# Patient Record
Sex: Male | Born: 1935
Health system: Southern US, Community
[De-identification: ages and names within clinical notes are randomized; demographics above are authoritative.]

## PROBLEM LIST (undated history)

## (undated) DIAGNOSIS — M129 Arthropathy, unspecified: Secondary | ICD-10-CM

## (undated) DIAGNOSIS — I959 Hypotension, unspecified: Secondary | ICD-10-CM

## (undated) DIAGNOSIS — E559 Vitamin D deficiency, unspecified: Secondary | ICD-10-CM

## (undated) DIAGNOSIS — E785 Hyperlipidemia, unspecified: Secondary | ICD-10-CM

## (undated) DIAGNOSIS — M6240 Contracture of muscle, unspecified site: Secondary | ICD-10-CM

## (undated) DIAGNOSIS — E039 Hypothyroidism, unspecified: Secondary | ICD-10-CM

## (undated) DIAGNOSIS — I10 Essential (primary) hypertension: Secondary | ICD-10-CM

## (undated) DIAGNOSIS — H269 Unspecified cataract: Secondary | ICD-10-CM

## (undated) DIAGNOSIS — H409 Unspecified glaucoma: Secondary | ICD-10-CM

## (undated) DIAGNOSIS — Z77090 Contact with and (suspected) exposure to asbestos: Secondary | ICD-10-CM

## (undated) DIAGNOSIS — C439 Malignant melanoma of skin, unspecified: Secondary | ICD-10-CM

## (undated) DIAGNOSIS — C459 Mesothelioma, unspecified: Secondary | ICD-10-CM

## (undated) DIAGNOSIS — R972 Elevated prostate specific antigen [PSA]: Secondary | ICD-10-CM

## (undated) DIAGNOSIS — D649 Anemia, unspecified: Secondary | ICD-10-CM

## (undated) HISTORY — PX: SKIN CANCER EXCISION: SHX779

## (undated) HISTORY — DX: Malignant melanoma of skin, unspecified: C43.9

## (undated) HISTORY — DX: Hypothyroidism, unspecified: E03.9

## (undated) HISTORY — DX: Unspecified cataract: H26.9

## (undated) HISTORY — DX: Hyperlipidemia, unspecified: E78.5

## (undated) HISTORY — DX: Contact with and (suspected) exposure to asbestos: Z77.090

## (undated) HISTORY — DX: Essential (primary) hypertension: I10

## (undated) HISTORY — DX: Hypotension, unspecified: I95.9

## (undated) HISTORY — DX: Vitamin D deficiency, unspecified: E55.9

## (undated) HISTORY — PX: OTHER SURGICAL HISTORY: SHX169

## (undated) HISTORY — DX: Arthropathy, unspecified: M12.9

## (undated) HISTORY — DX: Unspecified glaucoma: H40.9

## (undated) HISTORY — DX: Elevated prostate specific antigen (PSA): R97.20

## (undated) HISTORY — DX: Contracture of muscle, unspecified site: M62.40

## (undated) HISTORY — DX: Anemia, unspecified: D64.9

---
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
## 2002-07-25 ENCOUNTER — Encounter: Admission: RE | Admit: 2002-07-25 | Discharge: 2002-07-25 | Payer: Self-pay | Admitting: *Deleted

## 2003-10-10 ENCOUNTER — Encounter: Admission: RE | Admit: 2003-10-10 | Discharge: 2003-10-10 | Payer: Self-pay | Admitting: Internal Medicine

## 2004-09-02 ENCOUNTER — Ambulatory Visit: Payer: Self-pay | Admitting: Family Medicine

## 2004-09-30 ENCOUNTER — Ambulatory Visit: Payer: Self-pay | Admitting: Family Medicine

## 2005-10-13 ENCOUNTER — Ambulatory Visit: Payer: Self-pay | Admitting: Family Medicine

## 2005-11-10 ENCOUNTER — Ambulatory Visit: Payer: Self-pay | Admitting: Family Medicine

## 2006-08-09 ENCOUNTER — Ambulatory Visit: Payer: Self-pay | Admitting: Family Medicine

## 2007-01-27 ENCOUNTER — Encounter: Payer: Self-pay | Admitting: Family Medicine

## 2007-01-27 ENCOUNTER — Ambulatory Visit: Payer: Self-pay | Admitting: Family Medicine

## 2007-01-27 DIAGNOSIS — I10 Essential (primary) hypertension: Secondary | ICD-10-CM | POA: Insufficient documentation

## 2007-01-27 LAB — CONVERTED CEMR LAB
ALT: 28 units/L (ref 0–40)
AST: 35 units/L (ref 0–37)
Albumin: 4.6 g/dL (ref 3.5–5.2)
Alkaline Phosphatase: 44 units/L (ref 39–117)
BUN: 27 mg/dL — ABNORMAL HIGH (ref 6–23)
Basophils Absolute: 0 10*3/uL (ref 0.0–0.1)
Basophils Relative: 0.1 % (ref 0.0–1.0)
Bilirubin, Direct: 0.3 mg/dL (ref 0.0–0.3)
CO2: 32 meq/L (ref 19–32)
Calcium: 9.4 mg/dL (ref 8.4–10.5)
Chloride: 102 meq/L (ref 96–112)
Cholesterol: 221 mg/dL (ref 0–200)
Creatinine, Ser: 1.2 mg/dL (ref 0.4–1.5)
Direct LDL: 130 mg/dL
Eosinophils Absolute: 0.1 10*3/uL (ref 0.0–0.6)
Eosinophils Relative: 1.1 % (ref 0.0–5.0)
GFR calc Af Amer: 76 mL/min
GFR calc non Af Amer: 63 mL/min
Glucose, Bld: 106 mg/dL — ABNORMAL HIGH (ref 70–99)
HCT: 40.1 % (ref 39.0–52.0)
HDL: 64.6 mg/dL (ref >39.0)
Hemoglobin: 14.3 g/dL (ref 13.0–17.0)
Lymphocytes Relative: 20.1 % (ref 12.0–46.0)
MCHC: 35.6 g/dL (ref 30.0–36.0)
MCV: 95.8 fL (ref 78.0–100.0)
Monocytes Absolute: 0.6 10*3/uL (ref 0.2–0.7)
Monocytes Relative: 7.9 % (ref 3.0–11.0)
Neutro Abs: 5.8 10*3/uL (ref 1.4–7.7)
Neutrophils Relative %: 70.8 % (ref 43.0–77.0)
PSA: 1.25 ng/mL (ref 0.10–4.00)
Platelets: 246 10*3/uL (ref 150–400)
Potassium: 4.5 meq/L (ref 3.5–5.1)
RBC: 4.19 M/uL — ABNORMAL LOW (ref 4.22–5.81)
RDW: 12.2 % (ref 11.5–14.6)
Sodium: 142 meq/L (ref 135–145)
TSH: 2.52 microintl units/mL (ref 0.35–5.50)
Total Bilirubin: 1.3 mg/dL — ABNORMAL HIGH (ref 0.3–1.2)
Total CHOL/HDL Ratio: 3.4
Total Protein: 7.6 g/dL (ref 6.0–8.3)
Triglycerides: 65 mg/dL (ref 0–149)
VLDL: 13 mg/dL (ref 0–40)
WBC: 8.1 10*3/uL (ref 4.5–10.5)

## 2008-02-07 ENCOUNTER — Telehealth: Payer: Self-pay | Admitting: Family Medicine

## 2008-02-08 ENCOUNTER — Ambulatory Visit: Payer: Self-pay | Admitting: Family Medicine

## 2008-02-08 DIAGNOSIS — E785 Hyperlipidemia, unspecified: Secondary | ICD-10-CM

## 2008-02-08 DIAGNOSIS — E039 Hypothyroidism, unspecified: Secondary | ICD-10-CM

## 2008-02-08 DIAGNOSIS — M6240 Contracture of muscle, unspecified site: Secondary | ICD-10-CM | POA: Insufficient documentation

## 2008-02-08 DIAGNOSIS — J61 Pneumoconiosis due to asbestos and other mineral fibers: Secondary | ICD-10-CM | POA: Insufficient documentation

## 2008-02-08 DIAGNOSIS — M129 Arthropathy, unspecified: Secondary | ICD-10-CM

## 2008-02-08 DIAGNOSIS — Z77090 Contact with and (suspected) exposure to asbestos: Secondary | ICD-10-CM

## 2008-02-08 DIAGNOSIS — D649 Anemia, unspecified: Secondary | ICD-10-CM | POA: Insufficient documentation

## 2008-02-08 DIAGNOSIS — R972 Elevated prostate specific antigen [PSA]: Secondary | ICD-10-CM | POA: Insufficient documentation

## 2008-02-08 HISTORY — DX: Hypothyroidism, unspecified: E03.9

## 2008-02-08 HISTORY — DX: Contact with and (suspected) exposure to asbestos: Z77.090

## 2008-02-08 HISTORY — DX: Hyperlipidemia, unspecified: E78.5

## 2008-02-08 HISTORY — DX: Arthropathy, unspecified: M12.9

## 2008-02-08 HISTORY — DX: Anemia, unspecified: D64.9

## 2008-02-08 HISTORY — DX: Contracture of muscle, unspecified site: M62.40

## 2008-02-08 HISTORY — DX: Elevated prostate specific antigen (PSA): R97.20

## 2008-02-09 LAB — CONVERTED CEMR LAB
ALT: 20 units/L (ref 0–53)
AST: 26 units/L (ref 0–37)
Albumin: 4.1 g/dL (ref 3.5–5.2)
Alkaline Phosphatase: 52 units/L (ref 39–117)
BUN: 30 mg/dL — ABNORMAL HIGH (ref 6–23)
Basophils Absolute: 0 10*3/uL (ref 0.0–0.1)
Basophils Relative: 0.7 % (ref 0.0–1.0)
Bilirubin, Direct: 0.2 mg/dL (ref 0.0–0.3)
CO2: 31 meq/L (ref 19–32)
Calcium: 9.9 mg/dL (ref 8.4–10.5)
Chloride: 101 meq/L (ref 96–112)
Cholesterol: 182 mg/dL (ref 0–200)
Creatinine, Ser: 1.5 mg/dL (ref 0.4–1.5)
Eosinophils Absolute: 0.1 10*3/uL (ref 0.0–0.7)
Eosinophils Relative: 1.8 % (ref 0.0–5.0)
GFR calc Af Amer: 59 mL/min
GFR calc non Af Amer: 48 mL/min
Glucose, Bld: 102 mg/dL — ABNORMAL HIGH (ref 70–99)
HCT: 37.9 % — ABNORMAL LOW (ref 39.0–52.0)
HDL: 51.3 mg/dL (ref >39.0)
Hemoglobin: 13.5 g/dL (ref 13.0–17.0)
LDL Cholesterol: 113 mg/dL — ABNORMAL HIGH (ref 0–99)
Lymphocytes Relative: 25.2 % (ref 12.0–46.0)
MCHC: 35.6 g/dL (ref 30.0–36.0)
MCV: 96.3 fL (ref 78.0–100.0)
Monocytes Absolute: 0.6 10*3/uL (ref 0.1–1.0)
Monocytes Relative: 10 % (ref 3.0–12.0)
Neutro Abs: 3.9 10*3/uL (ref 1.4–7.7)
Neutrophils Relative %: 62.3 % (ref 43.0–77.0)
PSA: 1.56 ng/mL (ref 0.10–4.00)
Platelets: 291 10*3/uL (ref 150–400)
Potassium: 4.6 meq/L (ref 3.5–5.1)
RBC: 3.93 M/uL — ABNORMAL LOW (ref 4.22–5.81)
RDW: 12.1 % (ref 11.5–14.6)
Sodium: 141 meq/L (ref 135–145)
TSH: 2.27 microintl units/mL (ref 0.35–5.50)
Total Bilirubin: 1.1 mg/dL (ref 0.3–1.2)
Total CHOL/HDL Ratio: 3.5
Total Protein: 7.3 g/dL (ref 6.0–8.3)
Triglycerides: 91 mg/dL (ref 0–149)
VLDL: 18 mg/dL (ref 0–40)
WBC: 6.2 10*3/uL (ref 4.5–10.5)

## 2008-02-10 ENCOUNTER — Ambulatory Visit: Payer: Self-pay | Admitting: Family Medicine

## 2008-02-28 ENCOUNTER — Encounter: Payer: Self-pay | Admitting: Family Medicine

## 2008-03-01 ENCOUNTER — Ambulatory Visit: Payer: Self-pay | Admitting: Cardiology

## 2008-04-26 ENCOUNTER — Encounter: Payer: Self-pay | Admitting: Family Medicine

## 2009-02-07 ENCOUNTER — Ambulatory Visit: Payer: Self-pay | Admitting: Family Medicine

## 2009-02-07 DIAGNOSIS — H409 Unspecified glaucoma: Secondary | ICD-10-CM | POA: Insufficient documentation

## 2009-02-07 DIAGNOSIS — E559 Vitamin D deficiency, unspecified: Secondary | ICD-10-CM

## 2009-02-07 HISTORY — DX: Unspecified glaucoma: H40.9

## 2009-02-07 HISTORY — DX: Vitamin D deficiency, unspecified: E55.9

## 2009-02-07 LAB — CONVERTED CEMR LAB
Bilirubin Urine: NEGATIVE
Glucose, Urine, Semiquant: NEGATIVE
Nitrite: NEGATIVE
Specific Gravity, Urine: 1.02
Urobilinogen, UA: 0.2
WBC Urine, dipstick: NEGATIVE
pH: 5.5

## 2009-02-13 LAB — CONVERTED CEMR LAB
ALT: 19 units/L (ref 0–53)
AST: 32 units/L (ref 0–37)
Albumin: 4.3 g/dL (ref 3.5–5.2)
Alkaline Phosphatase: 44 units/L (ref 39–117)
BUN: 17 mg/dL (ref 6–23)
Basophils Relative: 0 % (ref 0.0–3.0)
Bilirubin, Direct: 0.2 mg/dL (ref 0.0–0.3)
CO2: 32 meq/L (ref 19–32)
Calcium: 9.5 mg/dL (ref 8.4–10.5)
Chloride: 101 meq/L (ref 96–112)
Cholesterol: 198 mg/dL (ref 0–200)
Creatinine, Ser: 1.1 mg/dL (ref 0.4–1.5)
Eosinophils Relative: 1.4 % (ref 0.0–5.0)
GFR calc non Af Amer: 68.95 mL/min (ref >60)
Glucose, Bld: 88 mg/dL (ref 70–99)
HCT: 41.2 % (ref 39.0–52.0)
HDL: 69.1 mg/dL (ref >39.00)
Hemoglobin: 14.6 g/dL (ref 13.0–17.0)
LDL Cholesterol: 117 mg/dL — ABNORMAL HIGH (ref 0–99)
Lymphocytes Relative: 18.1 % (ref 12.0–46.0)
MCHC: 35.4 g/dL (ref 30.0–36.0)
MCV: 96.6 fL (ref 78.0–100.0)
Monocytes Relative: 6.6 % (ref 3.0–12.0)
Neutrophils Relative %: 73.9 % (ref 43.0–77.0)
PSA: 1.56 ng/mL (ref 0.10–4.00)
Platelets: 183 10*3/uL (ref 150.0–400.0)
Potassium: 3.9 meq/L (ref 3.5–5.1)
RBC: 4.27 M/uL (ref 4.22–5.81)
RDW: 12 % (ref 11.5–14.6)
Sodium: 140 meq/L (ref 135–145)
TSH: 1.72 microintl units/mL (ref 0.35–5.50)
Total Bilirubin: 1.3 mg/dL — ABNORMAL HIGH (ref 0.3–1.2)
Total CHOL/HDL Ratio: 3
Total Protein: 7.7 g/dL (ref 6.0–8.3)
Triglycerides: 62 mg/dL (ref 0.0–149.0)
VLDL: 12.4 mg/dL (ref 0.0–40.0)
Vit D, 25-Hydroxy: 22 ng/mL — ABNORMAL LOW (ref 30–89)
WBC: 9.9 10*3/uL (ref 4.5–10.5)

## 2010-02-10 ENCOUNTER — Telehealth: Payer: Self-pay | Admitting: Family Medicine

## 2010-02-18 ENCOUNTER — Ambulatory Visit: Payer: Self-pay | Admitting: Family Medicine

## 2010-02-18 DIAGNOSIS — I959 Hypotension, unspecified: Secondary | ICD-10-CM

## 2010-02-18 HISTORY — DX: Hypotension, unspecified: I95.9

## 2010-03-19 ENCOUNTER — Ambulatory Visit: Payer: Self-pay | Admitting: Family Medicine

## 2010-03-19 LAB — CONVERTED CEMR LAB
Nitrite: NEGATIVE
WBC Urine, dipstick: NEGATIVE

## 2010-03-20 LAB — CONVERTED CEMR LAB
AST: 27 units/L (ref 0–37)
Albumin: 4.5 g/dL (ref 3.5–5.2)
Alkaline Phosphatase: 51 units/L (ref 39–117)
Basophils Absolute: 0 10*3/uL (ref 0.0–0.1)
Bilirubin, Direct: 0.3 mg/dL (ref 0.0–0.3)
Calcium: 9.5 mg/dL (ref 8.4–10.5)
Creatinine, Ser: 1.1 mg/dL (ref 0.4–1.5)
Eosinophils Absolute: 0.1 10*3/uL (ref 0.0–0.7)
GFR calc non Af Amer: 71.76 mL/min (ref >60)
Glucose, Bld: 99 mg/dL (ref 70–99)
HDL: 65.8 mg/dL (ref >39.00)
Hemoglobin: 14.6 g/dL (ref 13.0–17.0)
Lymphocytes Relative: 20.8 % (ref 12.0–46.0)
Monocytes Relative: 9 % (ref 3.0–12.0)
Neutro Abs: 4 10*3/uL (ref 1.4–7.7)
Neutrophils Relative %: 68 % (ref 43.0–77.0)
RBC: 4.29 M/uL (ref 4.22–5.81)
RDW: 13.1 % (ref 11.5–14.6)
Sodium: 144 meq/L (ref 135–145)
Total CHOL/HDL Ratio: 2
Triglycerides: 58 mg/dL (ref 0.0–149.0)
VLDL: 11.6 mg/dL (ref 0.0–40.0)
Vit D, 25-Hydroxy: 35 ng/mL (ref 30–89)

## 2010-10-09 NOTE — Progress Notes (Signed)
Summary: REFILL REQUEST (Diovan)  Phone Note Refill Request Call back at [phone] Message from:  Patient on February 10, 2010 3:37 PM  Refills Requested: Medication #1:  DIOVAN HCT 160-25 MG  TABS 1 once daily   Notes: [facility] in [place] [employer] [place].  Pt adv that he set up an appt for cpx in July but he will run out of medication (Diovan) before his appt date/time.... Pt would like a Rx for at least a months worth of medication sent to [facility] in [place], [place].   Initial call taken by: Debbra Riding,  February 10, 2010 3:37 PM    Prescriptions: DIOVAN HCT 160-25 MG  TABS (VALSARTAN-HYDROCHLOROTHIAZIDE) 1 once daily  #30 x 0   Entered by:   Raechel Ache, RN   Authorized by:   Judithann Sheen MD   Signed by:   Raechel Ache, RN on 02/10/2010   Method used:   Electronically to        [facility] (retail)       [address]       [place], [place]  [address]       Ph: [phone]       Fax: [phone]   RxID:   [id number]

## 2010-10-09 NOTE — Assessment & Plan Note (Signed)
Summary: DIZZY SPELLS? // RS   Vital Signs:  Patient profile:   77 year old male Height:      69.5 inches Weight:      191 pounds BMI:     27.90 O2 Sat:      98 % Temp:     98.2 degrees F Pulse rate:   81 / minute Pulse rhythm:   regular BP sitting:   140 / 80  (left arm)  Vitals Entered By: Pura Spice, RN (February 18, 2010 10:00 AM) CC: dizzy spells and stopped BP med on Saturday. drinks 2 cups coffeee qd   Is Patient Diabetic? No   History of Present Illness: Disseminate-year-old white married male with previous hypertension complains of dizziness over the past 10 days and has noticed his blood pressure has been ranging from 90-110/70 and he decided to stop his blood pressure medicines of Diovan HCT he did improve as far as his discs He has observed that he had some PVCs and has decreased his caffeine which is improved Relates he is due to have a examination or review his problems on July 16 No other complaints    EKG  Procedure date:  02/18/2010  Findings:       sinus rhythm with rate of:  78 premature ventricular contractions right bundle branch block  Allergies: 1)  * Pennicillin  Past History:  Past Medical History: Last updated: 01/27/2007 HX ASBESTOS EXPOSURE Hypertension  Risk Factors: Smoking Status: quit (02/07/2009) Packs/Day: 1.0 (02/07/2009)  Review of Systems      See HPI  The patient denies anorexia, fever, weight loss, weight gain, vision loss, decreased hearing, hoarseness, chest pain, syncope, dyspnea on exertion, peripheral edema, prolonged cough, headaches, hemoptysis, abdominal pain, melena, hematochezia, severe indigestion/heartburn, hematuria, incontinence, genital sores, muscle weakness, suspicious skin lesions, transient blindness, difficulty walking, depression, unusual weight change, abnormal bleeding, enlarged lymph nodes, angioedema, breast masses, and testicular masses.    Physical Exam  General:   Well-developed,well-nourished,in no acute distress; alert,appropriate and cooperative throughout examination Head:  Normocephalic and atraumatic without obvious abnormalities. No apparent alopecia or balding. Eyes:  No corneal or conjunctival inflammation noted. EOMI. Perrla. Funduscopic exam benign, without hemorrhages, exudates or papilledema. Vision grossly normal. Ears:  External ear exam shows no significant lesions or deformities.  Otoscopic examination reveals clear canals, tympanic membranes are intact bilaterally without bulging, retraction, inflammation or discharge. Hearing is grossly normal bilaterally. Nose:  External nasal examination shows no deformity or inflammation. Nasal mucosa are pink and moist without lesions or exudates. Mouth:  Oral mucosa and oropharynx without lesions or exudates.  Teeth in good repair. Neck:  No deformities, masses, or tenderness noted. Chest Wall:  No deformities, masses, tenderness or gynecomastia noted. Lungs:  Normal respiratory effort, chest expands symmetrically. Lungs are clear to auscultation, no crackles or wheezes. Heart:  Normal rate and regular rhythm. S1 and S2 normal without gallop, murmur, click, rub or other extra sounds. Extremities:  no exam   Impression & Recommendations:  Problem # 1:  HYPOTENSION (ICD-458.9) Assessment New decreased Diovan 80 mg  Problem # 2:  DIZZINESS (ICD-780.4)  Orders: EKG w/ Interpretation (93000) rare PVC otherwise normal decrease hypertension medicine  Problem # 3:  ARTHRITIS (ICD-716.90) Assessment: Improved  Complete Medication List: 1)  Xalatan 0.005 % Soln (Latanoprost) 2)  Vitamin D (ergocalciferol) 50000 Unit Caps (Ergocalciferol) .Marland Kitchen.. 1 by mouth weekly 3)  Lisinopril 20 Mg Tabs (Lisinopril) .Marland Kitchen.. 1 once daily for bp 4)  Diovan 80 Mg  Tabs (Valsartan) .... One q. day  Patient Instructions: 1)  My impression is that the drop in her blood pressure and closure dizziness visual wire by  stopping her previous medication dosage if you have improved 2)  Since stopping it however her blood pressure has increased to 140/80 and I recommend that you restart Diovan 80 mg q. day 3)  We will reevaluate when you come in on July 60 Prescriptions: LISINOPRIL 20 MG TABS (LISINOPRIL) 1 once daily for BP  #30 x 11   Entered and Authorized by:   Judithann Sheen MD   Signed by:   Judithann Sheen MD on 02/18/2010   Method used:   Electronically to        [facility] (retail)       [address]       [place], [place]  [address]       Ph: [phone]       Fax: [phone]   RxID:   [id number]

## 2010-10-09 NOTE — Assessment & Plan Note (Signed)
Summary: emp-will fast///ccm rsc bmp/njr   Vital Signs:  Patient profile:   77 year old Thomas [name] Height:      70 inches Weight:      189 pounds BMI:     27.22 O2 Sat:      96 % Temp:     98 degrees F Pulse rate:   78 / minute Pulse rhythm:   regular BP sitting:   144 / 84  (left arm)  Vitals Entered By: Pura Spice, RN (March 19, 2010 8:30 AM)  Contraindications/Deferment of Procedures/Staging:    Test/Procedure: Colonoscopy    Reason for deferment: patient declined     Test/Procedure: Pneumovax vaccine    Reason for deferment: patient declined  CC: go over problems fasting     History of Present Illness: Rep3at BP 83/ 65This 77 year old white married Thomas [name] is in today to discussH his medical problems and refill his medications however he has not been taking his blood pressure medicine Diovan Since his blood pressure was elevated slightly when he came in to change her Diovan to lisinopril 20 mg. He relates he is very active has no complaints, his arthritic problem in the past had been under control he does not take any medicines at this time. He is fasting so we will get this served blood test today Discussed colonoscopic exam but patient has never had an exam then does not desire to have one in the future.  Allergies: 1)  * Pennicillin  Past History:  Past Medical History: Last updated: 01/27/2007 HX ASBESTOS EXPOSURE Hypertension  Risk Factors: Smoking Status: quit (02/07/2009) Packs/Day: 1.0 (02/07/2009)  Past Surgical History: Dupyreenes  contractures bilaterally ,Dr. Butler Denmark  Past History:  Care Management: Dermatology: Dr Nita Sells Ophthalmology: Dr Eulah Pont   Review of Systems      See HPI  The patient denies anorexia, fever, weight loss, weight gain, vision loss, decreased hearing, hoarseness, chest pain, syncope, dyspnea on exertion, peripheral edema, prolonged cough, headaches, hemoptysis, abdominal pain, melena, hematochezia, severe  indigestion/heartburn, hematuria, incontinence, genital sores, muscle weakness, suspicious skin lesions, transient blindness, difficulty walking, depression, unusual weight change, abnormal bleeding, enlarged lymph nodes, angioedema, breast masses, and testicular masses.    Physical Exam  General:  Well-developed,well-nourished,in no acute distress; alert,appropriate and cooperative throughout examination Head:  Normocephalic and atraumatic without obvious abnormalities. No apparent alopecia or balding. Eyes:  No corneal or conjunctival inflammation noted. EOMI. Perrla. Funduscopic exam benign, without hemorrhages, exudates or papilledema. Vision grossly normal. Ears:  External ear exam shows no significant lesions or deformities.  Otoscopic examination reveals clear canals, tympanic membranes are intact bilaterally without bulging, retraction, inflammation or discharge. Hearing is grossly normal bilaterally. Nose:  External nasal examination shows no deformity or inflammation. Nasal mucosa are pink and moist without lesions or exudates. Mouth:  Oral mucosa and oropharynx without lesions or exudates.  Teeth in good repair. Neck:  No deformities, masses, or tenderness noted. Chest Wall:  No deformities, masses, tenderness or gynecomastia noted. Breasts:  No masses or gynecomastia noted Lungs:  Normal respiratory effort, chest expands symmetrically. Lungs are clear to auscultation, no crackles or wheezes. Heart:  Normal rate and regular rhythm. S1 and S2 normal without gallop, murmur, click, rub or other extra sounds. Abdomen:  Bowel sounds positive,abdomen soft and non-tender without masses, organomegaly or hernias noted. Rectal:  No external abnormalities noted. Normal sphincter tone. No rectal masses or tenderness. Genitalia:  Testes bilaterally descended without nodularity, tenderness or masses. No scrotal masses or lesions. No  penis lesions or urethral discharge. Prostate:  Prostate gland firm  and smooth, no enlargement, nodularity, tenderness, mass, asymmetry or induration. Msk:  No deformity or scoliosis noted of thoracic or lumbar spine.   Pulses:  R and L carotid,radial,femoral,dorsalis pedis and posterior tibial pulses are full and equal bilaterally Extremities:  No clubbing, cyanosis, edema, or deformity noted with normal full range of motion of all joints.   Neurologic:  No cranial nerve deficits noted. Station and gait are normal. Plantar reflexes are down-going bilaterally. DTRs are symmetrical throughout. Sensory, motor and coordinative functions appear intact. Skin:  Intact without suspicious lesions or rashes Cervical Nodes:  No lymphadenopathy noted Axillary Nodes:  No palpable lymphadenopathy Inguinal Nodes:  No significant adenopathy Psych:  Cognition and judgment appear intact. Alert and cooperative with normal attention span and concentration. 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 Tharon Aquas  Problem # 4:  ARTHRITIS (ICD-716.90) Assessment: Improved  Problem # 5:  HYPERTENSION (ICD-401.9) Assessment: Improved  The following medications were removed from the medication list:    Diovan 80 Mg Tabs (Valsartan) ..... One q. day His updated medication list for this problem includes:    Lisinopril 20 Mg Tabs (Lisinopril) .Marland Kitchen... 1 once daily for bp  Orders: Prescription Created Electronically [phone])  Complete Medication List: 1)  Xalatan 0.005 % Soln (Latanoprost) Thomas)  Vitamin D (ergocalciferol) 50000 Unit Caps (Ergocalciferol) .Marland Kitchen.. 1 by mouth weekly 3)  Lisinopril 20 Mg Tabs (Lisinopril) .Marland Kitchen.. 1 once daily for bp  Other Orders: UA Dipstick w/o  Micro (automated)  (81003) Venipuncture (91478) TLB-Lipid Panel (80061-LIPID) TLB-BMP (Basic Metabolic Panel-BMET) (80048-METABOL) TLB-CBC Platelet - w/Differential (85025-CBCD) TLB-Hepatic/Liver Function Pnl (80076-HEPATIC) TLB-TSH (Thyroid Stimulating Hormone) (84443-TSH) TLB-PSA (Prostate Specific Antigen) (84153-PSA)  Patient Instructions: 1)  Continue regu;ar activitystart Lisinopril if BP stays above 140/ Thomas)  Will call lab results   Eye Exam  Sept 2010 Eye Exam --Dr Eulah Pont    Laboratory Results   Urine Tests  Date/Time Recieved: March 19, 2010 12:21 PM  Date/Time Reported: March 19, 2010 12:21 PM   Routine Urinalysis   Color: yellow Appearance: Clear Glucose: negative   (Normal Range: Negative) Bilirubin: negative   (Normal Range: Negative) Ketone: negative   (Normal Range: Negative) Spec. Gravity: >=1.030   (Normal Range: 1.003-1.035) Blood: trace-lysed   (Normal Range: Negative) pH: 5.0   (Normal Range: 5.0-8.0) Protein: 1+   (Normal Range: Negative) Urobilinogen: 0.Thomas   (Normal Range: 0-1) Nitrite: negative   (Normal Range: Negative) Leukocyte Esterace: negative   (Normal Range: Negative)    Comments: Wynona Canes, CMA  March 19, 2010 12:21 PM

## 2011-09-25 ENCOUNTER — Encounter: Payer: Self-pay | Admitting: Family Medicine

## 2011-09-28 ENCOUNTER — Ambulatory Visit: Payer: Self-pay | Admitting: Family Medicine

## 2011-10-08 ENCOUNTER — Ambulatory Visit (INDEPENDENT_AMBULATORY_CARE_PROVIDER_SITE_OTHER): Payer: Medicare Other | Admitting: Family Medicine

## 2011-10-08 ENCOUNTER — Encounter: Payer: Self-pay | Admitting: Family Medicine

## 2011-10-08 VITALS — BP 210/90 | Temp 97.8°F | Ht 70.0 in | Wt 185.0 lb

## 2011-10-08 DIAGNOSIS — I10 Essential (primary) hypertension: Secondary | ICD-10-CM

## 2011-10-08 LAB — BASIC METABOLIC PANEL
BUN: 18 mg/dL (ref 6–23)
CO2: 33 mEq/L — ABNORMAL HIGH (ref 19–32)
Calcium: 9.3 mg/dL (ref 8.4–10.5)
GFR: 77.33 mL/min (ref >60.00)
Glucose, Bld: 163 mg/dL — ABNORMAL HIGH (ref 70–99)
Sodium: 140 mEq/L (ref 135–145)

## 2011-10-08 LAB — POCT URINALYSIS DIPSTICK
Glucose, UA: NEGATIVE
Nitrite, UA: NEGATIVE
Spec Grav, UA: 1.02
Urobilinogen, UA: 0.2
pH, UA: 6.5

## 2011-10-08 LAB — CBC WITH DIFFERENTIAL/PLATELET
Basophils Absolute: 0 10*3/uL (ref 0.0–0.1)
Basophils Relative: 0.7 % (ref 0.0–3.0)
Eosinophils Absolute: 0.1 10*3/uL (ref 0.0–0.7)
HCT: 39.7 % (ref 39.0–52.0)
Hemoglobin: 13.8 g/dL (ref 13.0–17.0)
Lymphs Abs: 1.6 10*3/uL (ref 0.7–4.0)
MCHC: 34.7 g/dL (ref 30.0–36.0)
MCV: 98.6 fl (ref 78.0–100.0)
Monocytes Absolute: 0.5 10*3/uL (ref 0.1–1.0)
Neutro Abs: 3.4 10*3/uL (ref 1.4–7.7)
RBC: 4.03 Mil/uL — ABNORMAL LOW (ref 4.22–5.81)
RDW: 12.9 % (ref 11.5–14.6)

## 2011-10-08 LAB — HEPATIC FUNCTION PANEL
AST: 23 U/L (ref 0–37)
Albumin: 4.1 g/dL (ref 3.5–5.2)
Alkaline Phosphatase: 42 U/L (ref 39–117)
Total Protein: 7.1 g/dL (ref 6.0–8.3)

## 2011-10-08 MED ORDER — ATENOLOL-CHLORTHALIDONE 50-25 MG PO TABS: 1.0000 | ORAL_TABLET | Freq: Every day | ORAL | Status: DC

## 2011-10-08 NOTE — Patient Instructions (Signed)
Stay at complete rest at home  Complete no salt diet  Blood pressure check 3 times daily,,,,,,,,,,,,,, purchase a digital pump up blood pressure cuff  Tenoretic.......... one half tablet now then starting tomorrow morning one tablet daily in the morning  Return on Monday at 8:15 AM for followup,,,,,,,,,, when you return bring a record of all your blood pressure readings and the new device

## 2011-10-08 NOTE — Progress Notes (Signed)
  Subjective:    Patient ID: Thomas [name], male    DOB: December 20, 1929, 78 y.o.   MRN: [id number]  HPI Thomas [name] is a 78 year old married male nonsmoker who comes in today as a new patient having transferred from Dr. Scotty Court  He takes no medications except for drops for glaucoma. He states he has a history of hypertension lost weight blood pressure came down to normal and therefore he stopped his medication. He states he took his blood pressure at home a month ago and it was normal. BP today 110/90 right arm sitting position pulse 70 and regular neurologically asymptomatic last physical examination and labs 2 years ago  His blood pressure was confirmed by me. At 12:45 PM he was given 0.1 of clonidine and 20 mg of Benicar by mouth. EP was monitored every 15 minutes x1 hour   finally his blood pressure began to drop it's now 180/80     Review of Systems    general cardiovascular neurologic review of systems otherwise negative Objective:   Physical Exam  Well-developed thin male in no acute distress examination of the heart and lungs are normal abdomen normal no abdominal aneurysm no bruit peripheral pulses normal except for trace edema      Assessment & Plan:  Malignant hypertension,,,,,,,,,,,,,,,,,,, medication given as above BP check every 15 minutes x4 after one hour the patient was discharged home on medication and bedrest to be followed up on Monday

## 2011-10-12 ENCOUNTER — Ambulatory Visit (INDEPENDENT_AMBULATORY_CARE_PROVIDER_SITE_OTHER): Payer: Medicare Other | Admitting: Family Medicine

## 2011-10-12 ENCOUNTER — Encounter: Payer: Self-pay | Admitting: Family Medicine

## 2011-10-12 VITALS — BP 170/78 | Temp 98.1°F | Wt 182.0 lb

## 2011-10-12 DIAGNOSIS — I10 Essential (primary) hypertension: Secondary | ICD-10-CM

## 2011-10-12 MED ORDER — AMLODIPINE BESYLATE 5 MG PO TABS: 5.0000 mg | ORAL_TABLET | Freq: Every day | ORAL | Status: DC

## 2011-10-12 NOTE — Patient Instructions (Addendum)
Continue the Tenoretic one tablet daily add Norvasc 5 mg daily  Complete salt free diet  Walk 30 minutes daily  Drank 3,,,,,,,,,,,,,,,,8 ounce glasses of water a day  Check your blood pressure daily in the morning and return in one week for followup   Your lab work looked fairly normal except your blood sugar was 163........... avoid sugar

## 2011-10-12 NOTE — Progress Notes (Signed)
  Subjective:    Patient ID: Thomas [name], male    DOB: Jul 18, 1930, 78 y.o.   MRN: [id number]  HPI Thomas [name] is a 78 year old married male nonsmoker who comes in today for reevaluation of hypertension  We saw him last Thursday blood pressure was 180/90. There is a typo in the original dictation. The typo red 110/80 that is incorrect. He was given clonidine and beta blocker are maintained here in the office for an hour Sally's blood pressure started dropped therefore recent he was sent home at bed rest on Tenoretic 50-25 daily. Today his blood pressure is 170/80 pulse is dropped to 70 from 80. No side effects from medication   Review of Systems General and cardiovascular review of systems otherwise negative    Objective:   Physical Exam Well-developed well-nourished male in no acute distress BP right arm sitting position 170/80       Assessment & Plan:  Hypertension not at goal plan is,,,,,,,,,,, continue Tenoretic add Norvasc 5 mg daily BP check every morning followup in one week 6,

## 2011-10-13 ENCOUNTER — Ambulatory Visit: Payer: Medicare Other | Admitting: Family Medicine

## 2011-10-19 ENCOUNTER — Encounter: Payer: Self-pay | Admitting: Family Medicine

## 2011-10-19 ENCOUNTER — Ambulatory Visit (INDEPENDENT_AMBULATORY_CARE_PROVIDER_SITE_OTHER): Payer: Medicare Other | Admitting: Family Medicine

## 2011-10-19 DIAGNOSIS — I10 Essential (primary) hypertension: Secondary | ICD-10-CM

## 2011-10-19 NOTE — Progress Notes (Signed)
  Subjective:    Patient ID: Thomas [name], male    DOB: 1929-09-28, 78 y.o.   MRN: [id number]  HPI Thomas [name] is a 78 year old married male nonsmoker who comes back today for reevaluation of hypertension  I have been working with him over the last 6 weeks because his blood pressure was markedly elevated. Norvasc 5 mg daily and Tenoretic 50-25 BP now 140/74 pulse 60 and regular   Review of Systems    general and cardiovascular review of systems negative Objective:   Physical Exam  Well-developed well-nourished male in no acute distress BP right arm sitting position 140/70      Assessment & Plan:  Hypertension at goal continue current therapy BP check weekly return when necessary

## 2011-10-19 NOTE — Patient Instructions (Signed)
Continue your current medication  For the next month check your blood pressure daily in the morning and then if it continues to be normal.number around 140 ,,,,,,,,,,, diastolic 85 or less,,,,,,,,,,, then check your blood pressure weekly  Return in one year for annual physical examination sooner if any problems

## 2012-07-29 ENCOUNTER — Other Ambulatory Visit: Payer: Self-pay | Admitting: Family Medicine

## 2012-08-23 ENCOUNTER — Other Ambulatory Visit: Payer: Self-pay | Admitting: Family Medicine

## 2012-09-01 ENCOUNTER — Other Ambulatory Visit: Payer: Self-pay | Admitting: Family Medicine

## 2012-09-01 MED ORDER — ATENOLOL-CHLORTHALIDONE 50-25 MG PO TABS: 1.0000 | ORAL_TABLET | Freq: Every day | ORAL | Status: DC

## 2012-09-01 MED ORDER — AMLODIPINE BESYLATE 5 MG PO TABS: 5.0000 mg | ORAL_TABLET | Freq: Every day | ORAL | Status: DC

## 2012-09-01 NOTE — Telephone Encounter (Signed)
Rx sent to pharmacy   

## 2012-09-01 NOTE — Telephone Encounter (Signed)
Pt has cpx sch for 11-2012. Pt needs refills on norvasc 5 mg and tenoretic 50-25 mg call into [facility] in [place]

## 2012-11-09 ENCOUNTER — Encounter: Payer: Medicare Other | Admitting: Family Medicine

## 2012-12-28 ENCOUNTER — Ambulatory Visit (INDEPENDENT_AMBULATORY_CARE_PROVIDER_SITE_OTHER)
Admission: RE | Admit: 2012-12-28 | Discharge: 2012-12-28 | Disposition: A | Discharge status: Home / Self Care | Payer: Medicare Other | Source: Ambulatory Visit | Attending: Family Medicine | Admitting: Family Medicine

## 2012-12-28 ENCOUNTER — Encounter: Payer: Self-pay | Admitting: Family Medicine

## 2012-12-28 ENCOUNTER — Ambulatory Visit (INDEPENDENT_AMBULATORY_CARE_PROVIDER_SITE_OTHER): Payer: Medicare Other | Admitting: Family Medicine

## 2012-12-28 VITALS — BP 124/80 | Temp 98.3°F | Ht 70.0 in | Wt 180.0 lb

## 2012-12-28 DIAGNOSIS — I10 Essential (primary) hypertension: Secondary | ICD-10-CM

## 2012-12-28 DIAGNOSIS — R972 Elevated prostate specific antigen [PSA]: Secondary | ICD-10-CM

## 2012-12-28 DIAGNOSIS — Z77090 Contact with and (suspected) exposure to asbestos: Secondary | ICD-10-CM

## 2012-12-28 DIAGNOSIS — Z0000 Encounter for general adult medical examination without abnormal findings: Secondary | ICD-10-CM

## 2012-12-28 DIAGNOSIS — H409 Unspecified glaucoma: Secondary | ICD-10-CM

## 2012-12-28 LAB — POCT URINALYSIS DIPSTICK
Blood, UA: NEGATIVE
Spec Grav, UA: 1.015
Urobilinogen, UA: 0.2
pH, UA: 7

## 2012-12-28 LAB — BASIC METABOLIC PANEL
BUN: 18 mg/dL (ref 6–23)
CO2: 32 mEq/L (ref 19–32)
Calcium: 9.5 mg/dL (ref 8.4–10.5)
Creatinine, Ser: 1.1 mg/dL (ref 0.4–1.5)
Glucose, Bld: 91 mg/dL (ref 70–99)

## 2012-12-28 LAB — CBC WITH DIFFERENTIAL/PLATELET
Basophils Absolute: 0 10*3/uL (ref 0.0–0.1)
Eosinophils Absolute: 0.1 10*3/uL (ref 0.0–0.7)
Hemoglobin: 14.5 g/dL (ref 13.0–17.0)
Lymphocytes Relative: 32.4 % (ref 12.0–46.0)
MCHC: 34.4 g/dL (ref 30.0–36.0)
Neutro Abs: 3.2 10*3/uL (ref 1.4–7.7)
RDW: 12.6 % (ref 11.5–14.6)

## 2012-12-28 LAB — TSH: TSH: 2.02 u[IU]/mL (ref 0.35–5.50)

## 2012-12-28 MED ORDER — AMLODIPINE BESYLATE 5 MG PO TABS: 5.0000 mg | ORAL_TABLET | Freq: Every day | ORAL | Status: DC

## 2012-12-28 MED ORDER — ATENOLOL-CHLORTHALIDONE 50-25 MG PO TABS: ORAL_TABLET | ORAL | Status: DC

## 2012-12-28 NOTE — Patient Instructions (Signed)
Decrease the Tenoretic,,,,,,,,,,,,,,,, only take one half tab daily in the morning  Check your blood pressure daily in the morning  Return in 4 weeks for followup  When he returned bring a record of all your blood pressure readings and the device

## 2012-12-28 NOTE — Progress Notes (Signed)
  Subjective:    Patient ID: Thomas [name], male    DOB: 05-May-1930, 79 y.o.   MRN: [id number]  HPI Thomas [name] is an 79 year old married male nonsmoker who comes in today for a Medicare wellness examination because of a history of hypertension, glaucoma, and history of asbestos exposure  He gets routine eye care and uses eyedrops for the glaucoma. Recent eye exam by Dr. Eulah Pont normal pressures. He gets regular dental care. Vaccinations up-to-date. Cognitive function normal he walks on a daily basis home health safety reviewed no issues identified, no guns in the house, he does have a health care power of attorney and living will  Medications reviewed in detail BP 124/80. He states he's not lightheaded when he stands up but at age 79 I would like to see his systolic blood pressure a little higher   Review of Systems  Constitutional: Negative.   HENT: Negative.   Eyes: Negative.   Respiratory: Negative.   Cardiovascular: Negative.   Gastrointestinal: Negative.   Genitourinary: Negative.   Musculoskeletal: Negative.   Skin: Negative.   Neurological: Negative.   Psychiatric/Behavioral: Negative.        Objective:   Physical Exam  Constitutional: He is oriented to person, place, and time. He appears well-developed and well-nourished.  HENT:  Head: Normocephalic and atraumatic.  Right Ear: External ear normal.  Left Ear: External ear normal.  Nose: Nose normal.  Mouth/Throat: Oropharynx is clear and moist.  Eyes: Conjunctivae and EOM are normal. Pupils are equal, round, and reactive to light.  Neck: Normal range of motion. Neck supple. No JVD present. No tracheal deviation present. No thyromegaly present.  Cardiovascular: Normal rate, regular rhythm, normal heart sounds and intact distal pulses.  Exam reveals no gallop and no friction rub.   No murmur heard. No carotid or aortic bruits peripheral pulses normal  Pulmonary/Chest: Effort normal and breath sounds normal. No stridor. No  respiratory distress. He has no wheezes. He has no rales. He exhibits no tenderness.  Barrel-shaped chest  Abdominal: Soft. Bowel sounds are normal. He exhibits no distension and no mass. There is no tenderness. There is no rebound and no guarding.  Genitourinary: Rectum normal, prostate normal and penis normal. Guaiac negative stool. No penile tenderness.  Musculoskeletal: Normal range of motion. He exhibits no edema and no tenderness.  Lymphadenopathy:    He has no cervical adenopathy.  Neurological: He is alert and oriented to person, place, and time. He has normal reflexes. No cranial nerve deficit. He exhibits normal muscle tone.  Skin: Skin is warm and dry. No rash noted. No erythema. No pallor.  Psychiatric: He has a normal mood and affect. His behavior is normal. Judgment and thought content normal.          Assessment & Plan:  Hypertension BP too low decrease Tenoretic to one half tab daily BP check daily followup in 4 weeks  Glaucoma continue eyedrops  History of asbestos exposure chest x-ray  History of elevated PSA with normal exam

## 2013-01-23 ENCOUNTER — Encounter: Payer: Self-pay | Admitting: Family Medicine

## 2013-01-23 ENCOUNTER — Ambulatory Visit (INDEPENDENT_AMBULATORY_CARE_PROVIDER_SITE_OTHER): Payer: Medicare Other | Admitting: Family Medicine

## 2013-01-23 VITALS — BP 140/70 | Temp 98.1°F | Wt 180.0 lb

## 2013-01-23 DIAGNOSIS — I10 Essential (primary) hypertension: Secondary | ICD-10-CM

## 2013-01-23 NOTE — Progress Notes (Signed)
  Subjective:    Patient ID: Thomas [name], male    DOB: 03-27-1930, 79 y.o.   MRN: [id number]  HPI Flank is a 79 year old male who comes in today for followup of 2 problems  We have been really adjusting his medication for hypertension BP 140/70 on Norvasc 5 mg daily and Tenoretic one half tab daily  Recent chest x-ray shows no change in the pleural lesions. He's had a history of his spasticity exposure   Review of Systems    review of systems negative Objective:   Physical Exam  Well-developed well-nourished male in no acute distress BP right arm sitting position 140/70      Assessment & Plan:  Hypertension at goal continue current therapy followup CPX in one year  History of asbestos exposure stable chest x-ray

## 2013-01-23 NOTE — Patient Instructions (Signed)
Continue your current medications  Check your blood pressure weekly  Goal for the systolic 135-145,,,,,,,, diastolic 85-95  Return in one year for general physical examination when you are due

## 2013-04-12 ENCOUNTER — Other Ambulatory Visit: Payer: Self-pay

## 2013-07-13 ENCOUNTER — Other Ambulatory Visit: Payer: Self-pay

## 2014-01-09 ENCOUNTER — Ambulatory Visit (INDEPENDENT_AMBULATORY_CARE_PROVIDER_SITE_OTHER): Payer: Medicare Other | Admitting: Family Medicine

## 2014-01-09 ENCOUNTER — Ambulatory Visit (INDEPENDENT_AMBULATORY_CARE_PROVIDER_SITE_OTHER)
Admission: RE | Admit: 2014-01-09 | Discharge: 2014-01-09 | Disposition: A | Discharge status: Home / Self Care | Payer: Medicare Other | Source: Ambulatory Visit | Attending: Family Medicine | Admitting: Family Medicine

## 2014-01-09 ENCOUNTER — Encounter: Payer: Self-pay | Admitting: Family Medicine

## 2014-01-09 VITALS — BP 120/80 | Temp 98.6°F | Ht 69.75 in | Wt 180.0 lb

## 2014-01-09 DIAGNOSIS — R972 Elevated prostate specific antigen [PSA]: Secondary | ICD-10-CM

## 2014-01-09 DIAGNOSIS — Z0000 Encounter for general adult medical examination without abnormal findings: Secondary | ICD-10-CM

## 2014-01-09 DIAGNOSIS — Z77090 Contact with and (suspected) exposure to asbestos: Secondary | ICD-10-CM

## 2014-01-09 DIAGNOSIS — M129 Arthropathy, unspecified: Secondary | ICD-10-CM

## 2014-01-09 DIAGNOSIS — M6240 Contracture of muscle, unspecified site: Secondary | ICD-10-CM

## 2014-01-09 DIAGNOSIS — D649 Anemia, unspecified: Secondary | ICD-10-CM

## 2014-01-09 DIAGNOSIS — I10 Essential (primary) hypertension: Secondary | ICD-10-CM

## 2014-01-09 DIAGNOSIS — H409 Unspecified glaucoma: Secondary | ICD-10-CM

## 2014-01-09 LAB — CBC WITH DIFFERENTIAL/PLATELET
BASOS ABS: 0 10*3/uL (ref 0.0–0.1)
Basophils Relative: 0.6 % (ref 0.0–3.0)
EOS ABS: 0.1 10*3/uL (ref 0.0–0.7)
Eosinophils Relative: 0.8 % (ref 0.0–5.0)
HEMATOCRIT: 40.8 % (ref 39.0–52.0)
Hemoglobin: 14.1 g/dL (ref 13.0–17.0)
LYMPHS ABS: 1.9 10*3/uL (ref 0.7–4.0)
Lymphocytes Relative: 25.8 % (ref 12.0–46.0)
MCHC: 34.6 g/dL (ref 30.0–36.0)
MCV: 97.3 fl (ref 78.0–100.0)
Monocytes Absolute: 0.7 10*3/uL (ref 0.1–1.0)
Monocytes Relative: 10.1 % (ref 3.0–12.0)
NEUTROS PCT: 62.7 % (ref 43.0–77.0)
Neutro Abs: 4.6 10*3/uL (ref 1.4–7.7)
Platelets: 255 10*3/uL (ref 150.0–400.0)
RBC: 4.2 Mil/uL — ABNORMAL LOW (ref 4.22–5.81)
RDW: 12.6 % (ref 11.5–14.6)
WBC: 7.3 10*3/uL (ref 4.5–10.5)

## 2014-01-09 LAB — HEPATIC FUNCTION PANEL
ALT: 15 U/L (ref 0–53)
AST: 30 U/L (ref 0–37)
Albumin: 4.4 g/dL (ref 3.5–5.2)
Alkaline Phosphatase: 47 U/L (ref 39–117)
BILIRUBIN DIRECT: 0.2 mg/dL (ref 0.0–0.3)
Total Bilirubin: 1 mg/dL (ref 0.2–1.2)
Total Protein: 7.5 g/dL (ref 6.0–8.3)

## 2014-01-09 LAB — POCT URINALYSIS DIPSTICK
Bilirubin, UA: NEGATIVE
Glucose, UA: NEGATIVE
Leukocytes, UA: NEGATIVE
Nitrite, UA: NEGATIVE
PH UA: 5
Spec Grav, UA: 1.02
UROBILINOGEN UA: 0.2

## 2014-01-09 LAB — LIPID PANEL
Cholesterol: 190 mg/dL (ref 0–200)
HDL: 69 mg/dL (ref >39.00)
LDL Cholesterol: 110 mg/dL — ABNORMAL HIGH (ref 0–99)
Total CHOL/HDL Ratio: 3
Triglycerides: 57 mg/dL (ref 0.0–149.0)
VLDL: 11.4 mg/dL (ref 0.0–40.0)

## 2014-01-09 LAB — BASIC METABOLIC PANEL
BUN: 23 mg/dL (ref 6–23)
CALCIUM: 9.7 mg/dL (ref 8.4–10.5)
CHLORIDE: 99 meq/L (ref 96–112)
CO2: 30 mEq/L (ref 19–32)
CREATININE: 1.1 mg/dL (ref 0.4–1.5)
GFR: 71.07 mL/min (ref >60.00)
Glucose, Bld: 89 mg/dL (ref 70–99)
Potassium: 4.1 mEq/L (ref 3.5–5.1)
Sodium: 139 mEq/L (ref 135–145)

## 2014-01-09 LAB — PSA: PSA: 1.52 ng/mL (ref 0.10–4.00)

## 2014-01-09 LAB — TSH: TSH: 2.69 u[IU]/mL (ref 0.35–4.50)

## 2014-01-09 MED ORDER — ATENOLOL-CHLORTHALIDONE 50-25 MG PO TABS: ORAL_TABLET | ORAL | Status: DC

## 2014-01-09 MED ORDER — AMLODIPINE BESYLATE 5 MG PO TABS: 5.0000 mg | ORAL_TABLET | Freq: Every day | ORAL | Status: DC

## 2014-01-09 NOTE — Progress Notes (Signed)
Pre visit review using our clinic review tool, if applicable. No additional management support is needed unless otherwise documented below in the visit note. 

## 2014-01-09 NOTE — Progress Notes (Signed)
   Subjective:    Patient ID: Thomas [name], male    DOB: Dec 26, 1929, 80 y.o.   MRN: [id number]  HPI [name] is a 80 year old married male nonsmoker who comes in today for a Medicare wellness examination because of a history of hypertension, glaucoma, osteoarthritis, elevated PSA, history of asbestos exposure  He gets routine eye care and but not dental care. He's no longer needs colonoscopy. He declines vaccination updates  Cognitive function normal he walks on a daily basis home health safety reviewed no issues identified, no his guns in the house, he does have a health care power of attorney and living will  He is profoundly deaf and does not have his hearing aids today.   Review of Systems  Constitutional: Negative.   HENT: Negative.   Eyes: Negative.   Respiratory: Negative.   Cardiovascular: Negative.   Gastrointestinal: Negative.   Genitourinary: Negative.   Musculoskeletal: Negative.   Skin: Negative.   Neurological: Negative.   Psychiatric/Behavioral: Negative.        Objective:   Physical Exam  Nursing note and vitals reviewed. Constitutional: He is oriented to person, place, and time. He appears well-developed and well-nourished.  HENT:  Head: Normocephalic and atraumatic.  Right Ear: External ear normal.  Left Ear: External ear normal.  Nose: Nose normal.  Mouth/Throat: Oropharynx is clear and moist.  Eyes: Conjunctivae and EOM are normal. Pupils are equal, round, and reactive to light.  Neck: Normal range of motion. Neck supple. No JVD present. No tracheal deviation present. No thyromegaly present.  Cardiovascular: Normal rate, regular rhythm, normal heart sounds and intact distal pulses.  Exam reveals no gallop and no friction rub.   No murmur heard. Pulmonary/Chest: Effort normal and breath sounds normal. No stridor. No respiratory distress. He has no wheezes. He has no rales. He exhibits no tenderness.  Abdominal: Soft. Bowel sounds are normal. He exhibits  no distension and no mass. There is no tenderness. There is no rebound and no guarding.  Genitourinary: Rectum normal, prostate normal and penis normal. Guaiac negative stool. No penile tenderness.  Musculoskeletal: Normal range of motion. He exhibits no edema and no tenderness.  Lymphadenopathy:    He has no cervical adenopathy.  Neurological: He is alert and oriented to person, place, and time. He has normal reflexes. No cranial nerve deficit. He exhibits normal muscle tone.  Skin: Skin is warm and dry. No rash noted. No erythema. No pallor.  Total body skin exam normal except for 3 and scar left subscapular area where he had a melanoma removed  Psychiatric: He has a normal mood and affect. His behavior is normal. Judgment and thought content normal.          Assessment & Plan:  Healthy male  Hypertension at goal continue current therapy  History of glaucoma continue eyedrops from ophthalmologists  Osteoarthritis exercise elevated PSA check PSA history of asbestos exposure

## 2014-01-09 NOTE — Patient Instructions (Signed)
Labs today  Go to the main office today for your followup chest x-ray  Return in one year for general Medicare wellness examination sooner if any problems  I would recommend you consider updating all your vaccinations

## 2014-01-10 ENCOUNTER — Telehealth: Payer: Self-pay | Admitting: Family Medicine

## 2014-01-10 NOTE — Telephone Encounter (Signed)
Relevant patient education assigned to patient using Emmi. ° °

## 2014-01-18 ENCOUNTER — Telehealth: Payer: Self-pay | Admitting: Family Medicine

## 2014-01-18 NOTE — Telephone Encounter (Signed)
Labs normal and patient is aware.

## 2014-01-18 NOTE — Telephone Encounter (Signed)
Pt needs blood work results °

## 2014-03-02 ENCOUNTER — Encounter: Payer: Self-pay | Admitting: Family Medicine

## 2015-01-16 ENCOUNTER — Encounter: Payer: Self-pay | Admitting: Physician Assistant

## 2015-01-16 ENCOUNTER — Ambulatory Visit (INDEPENDENT_AMBULATORY_CARE_PROVIDER_SITE_OTHER): Payer: Medicare Other | Admitting: Physician Assistant

## 2015-01-16 VITALS — BP 130/60 | HR 56 | Temp 97.8°F | Resp 18 | Ht 68.25 in | Wt 175.0 lb

## 2015-01-16 DIAGNOSIS — Z0000 Encounter for general adult medical examination without abnormal findings: Secondary | ICD-10-CM

## 2015-01-16 DIAGNOSIS — I10 Essential (primary) hypertension: Secondary | ICD-10-CM

## 2015-01-16 DIAGNOSIS — Z23 Encounter for immunization: Secondary | ICD-10-CM

## 2015-01-16 DIAGNOSIS — H409 Unspecified glaucoma: Secondary | ICD-10-CM | POA: Diagnosis not present

## 2015-01-16 LAB — CBC WITH DIFFERENTIAL/PLATELET
Basophils Absolute: 0.1 10*3/uL (ref 0.0–0.1)
Basophils Relative: 1 % (ref 0–1)
EOS ABS: 0.2 10*3/uL (ref 0.0–0.7)
Eosinophils Relative: 3 % (ref 0–5)
HCT: 39 % (ref 39.0–52.0)
Hemoglobin: 13.9 g/dL (ref 13.0–17.0)
LYMPHS ABS: 1.6 10*3/uL (ref 0.7–4.0)
Lymphocytes Relative: 25 % (ref 12–46)
MCH: 33.5 pg (ref 26.0–34.0)
MCHC: 35.6 g/dL (ref 30.0–36.0)
MCV: 94 fL (ref 78.0–100.0)
MONOS PCT: 8 % (ref 3–12)
MPV: 9.4 fL (ref 8.6–12.4)
Monocytes Absolute: 0.5 10*3/uL (ref 0.1–1.0)
Neutro Abs: 4 10*3/uL (ref 1.7–7.7)
Neutrophils Relative %: 63 % (ref 43–77)
Platelets: 232 10*3/uL (ref 150–400)
RBC: 4.15 MIL/uL — ABNORMAL LOW (ref 4.22–5.81)
RDW: 13.2 % (ref 11.5–15.5)
WBC: 6.3 10*3/uL (ref 4.0–10.5)

## 2015-01-16 LAB — COMPLETE METABOLIC PANEL WITH GFR
ALT: 12 U/L (ref 0–53)
AST: 23 U/L (ref 0–37)
Albumin: 4.1 g/dL (ref 3.5–5.2)
Alkaline Phosphatase: 47 U/L (ref 39–117)
BILIRUBIN TOTAL: 0.9 mg/dL (ref 0.2–1.2)
BUN: 23 mg/dL (ref 6–23)
CALCIUM: 9.3 mg/dL (ref 8.4–10.5)
CO2: 27 meq/L (ref 19–32)
CREATININE: 1.06 mg/dL (ref 0.50–1.35)
Chloride: 100 mEq/L (ref 96–112)
GFR, EST NON AFRICAN AMERICAN: 65 mL/min
GFR, Est African American: 75 mL/min
Glucose, Bld: 83 mg/dL (ref 70–99)
Potassium: 3.8 mEq/L (ref 3.5–5.3)
Sodium: 140 mEq/L (ref 135–145)
Total Protein: 7 g/dL (ref 6.0–8.3)

## 2015-01-16 LAB — LIPID PANEL
CHOL/HDL RATIO: 2.8 ratio
CHOLESTEROL: 176 mg/dL (ref 0–200)
HDL: 63 mg/dL (ref >=40)
LDL CALC: 95 mg/dL (ref 0–99)
TRIGLYCERIDES: 91 mg/dL (ref <150)
VLDL: 18 mg/dL (ref 0–40)

## 2015-01-16 LAB — TSH: TSH: 2.641 u[IU]/mL (ref 0.350–4.500)

## 2015-01-16 NOTE — Progress Notes (Signed)
Patient ID: Thomas [name] MRN: [id number], DOB: 1929/10/13 81 y.o. Date of Encounter: 01/16/2015, 2:14 PM    Chief Complaint: Physical (CPE)  HPI: 81 y.o. y/o white male here for CPE.  He also presents as a new patient to establish care with our practice.  He states that he was seeing Dr. Sherren Mocha with [facility] but that he is retiring. Says that his last visit with him was April 2015.  Also, patient's wife became a patient with her office about a year ago.  He says that recently he had been feeling some discomfort in his left low back. Says that he would feel it when he was bent forward to do the wash the dishes. Also would feel it when he walk. Says that he  never felt any discomfort when he was sitting. He says that actually this has been a lot better over the past week. Says that over the past week he has been busy lifting bags of concrete etc. and doesn't know if this is had any effect on it but says that the discomfort is actually been better. Has had no pain numbness or tingling down the leg or foot and no weakness in the leg or foot.  No other complaints or concerns today.  He said sees a dermatologist every 3 months since he was diagnosed with melanoma in 2014. He sees his ophthalmologist every 6 months. States that he sees no other medical providers on a routine basis.   Review of Systems: Consitutional: No fever, chills, fatigue, night sweats, lymphadenopathy, or weight changes. Eyes: No visual changes, eye redness, or discharge. ENT/Mouth: Ears: No otalgia, tinnitus, hearing loss, discharge. Nose: No congestion, rhinorrhea, sinus pain, or epistaxis. Throat: No sore throat, post nasal drip, or teeth pain. Cardiovascular: No CP, palpitations, diaphoresis, DOE, edema, orthopnea, PND. Respiratory: No cough, hemoptysis, SOB, or wheezing. Gastrointestinal: No anorexia, dysphagia, reflux, pain, nausea, vomiting, hematemesis, diarrhea, constipation, BRBPR, or  melena. Genitourinary: No dysuria, frequency, urgency, hematuria, incontinence, nocturia, decreased urinary stream, discharge, impotence, or testicular pain/masses. Musculoskeletal: No decreased ROM, myalgias, stiffness, joint swelling, or weakness. Skin: No rash, erythema, lesion changes, pain, warmth, jaundice, or pruritis. Neurological: No headache, dizziness, syncope, seizures, tremors, memory loss, coordination problems, or paresthesias. Psychological: No anxiety, depression, hallucinations, SI/HI. Endocrine: No fatigue, polydipsia, polyphagia, polyuria, or known diabetes. All other systems were reviewed and are otherwise negative.  Past Medical History  Diagnosis Date  . Hypertension   . ANEMIA 02/08/2008  . ARTHRITIS 02/08/2008  . 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 ELEVATED PROSTATE SPECIFIC ANTIGEN 02/08/2008  . HISTORY OF ASBESTOS EXPOSURE 02/08/2008  . HYPERLIPIDEMIA 02/08/2008  . HYPOTENSION 02/18/2010  . HYPOTHYROIDISM 02/08/2008  . VITAMIN D DEFICIENCY 02/07/2009  . Melanoma     left shoulder  . Cataract     left eye  . GLAUCOMA, LEFT EYE 02/07/2009    left eye     Past Surgical History  Procedure Laterality Date  . Dupyreenes contractures-bilat      Home Meds:  Outpatient Prescriptions Prior to Visit  Medication Sig Dispense Refill  . amLODipine (NORVASC) 5 MG tablet Take 1 tablet (5 mg total) by mouth daily. 90 tablet 3  . atenolol-chlorthalidone (TENORETIC 50) 50-25 MG per tablet One half tab every morning 90 tablet 3  . latanoprost (XALATAN) 0.005 % ophthalmic solution 1 drop at bedtime.    . Multiple Vitamins-Minerals (EYE VITAMINS) CAPS Take by mouth.     No facility-administered medications prior to visit.  Allergies:  Allergies  Allergen Reactions  . Penicillins Rash    REACTION: rash    History   Social History  . Marital Status: Married    Spouse Name: N/A  . Number of Children: N/A  . Years of Education: N/A   Occupational History  . Not on file.    Social History Main Topics  . Smoking status: Former Smoker    Quit date: 10/08/1971  . Smokeless tobacco: Not on file  . Alcohol Use: Not on file  . Drug Use: Not on file  . Sexual Activity: Not on file   Other Topics Concern  . Not on file   Social History Narrative    Family History  Problem Relation Age of Onset  . Hypertension Mother   . Heart disease Brother   . Cancer Sister     Physical Exam: Blood pressure 130/60, pulse 56, temperature 97.8 F (36.6 C), temperature source Oral, resp. rate 18, height 5' 8.25" (1.734 m), weight 175 lb (79.379 kg).  General: Well developed, well nourished, WM. Appears in no acute distress. HEENT: Normocephalic, atraumatic. Conjunctiva pink, sclera non-icteric. Pupils 2 mm constricting to 1 mm, round, regular, and equally reactive to light and accomodation. EOMI. Internal auditory canal clear. TMs with good cone of light and without pathology. Nasal mucosa pink. Nares are without discharge. No sinus tenderness. Oral mucosa pink. Pharynx without exudate.  He wears a hearing aid in the right ear.  Neck: Supple. Trachea midline. No thyromegaly. Full ROM. No lymphadenopathy. Lungs: Clear to auscultation bilaterally without wheezes, rales, or rhonchi. Breathing is of normal effort and unlabored. Cardiovascular: RRR with S1 S2. No murmurs, rubs, or gallops. Distal pulses 2+ symmetrically. No carotid or abdominal bruits. Abdomen: Soft, non-tender, non-distended with normoactive bowel sounds. No hepatosplenomegaly or masses. No rebound/guarding. No CVA tenderness. No hernias. Musculoskeletal: Full range of motion and 5/5 strength throughout. Without swelling, atrophy, tenderness, crepitus, or warmth. Extremities without clubbing, cyanosis, or edema.  Skin: Warm and moist without erythema, ecchymosis, wounds, or rash. Neuro: A+Ox3. CN II-XII grossly intact. Moves all extremities spontaneously. Full sensation throughout. Normal gait. DTR 2+ throughout  upper and lower extremities. Finger to nose intact. Psych:  Responds to questions appropriately with a normal affect.   Assessment/Plan:  81 y.o. y/o white male here for CPE  -1. Visit for preventive health examination  A. Screening Labs: - CBC with Differential/Platelet - COMPLETE METABOLIC PANEL WITH GFR - Lipid panel - TSH   B. Screening For Prostate Cancer: He had a PSA 01/09/2014 which was normal. Given his age 23, no longer needs prostate cancer screening.  C. Screening For Colorectal Cancer:  Given age of 40, no longer needs screening.  D. Immunizations: Flu-------------N/A Tetanus------Td 02/07/2009 Pneumococcal----when I reviewed the immunizations section of epic, I saw documented " Pneumovax 23" but no date listed next to it. I assumed that he had received Pneumovax 23 in the past but the date was just unknown.  I assumed he had received the Pneumovax 23 in the past but had not received Prevnar 13. I then discussed with the patient-- administering the Prevnar 13 today-- and he was agreeable. Therefore I told our staff to give him the Prevnar 13. However, after the fact----staff informed me that in another section of epic they saw documented "Pneumovax 23 declined". If this is the case, and he had not received Pneumovax 23 in the past,  then [place] 23 IN 6 - 12 MONTHS.  Zostavax--- will discuss this at next visit.   2. Essential hypertension Blood pressure well controlled. Continue current medications. Check lab to monitor. - COMPLETE METABOLIC PANEL WITH GFR  3. Glaucoma Per Ophthalmology  4. Need for prophylactic vaccination against Streptococcus pneumoniae (pneumococcus) - Pneumococcal conjugate vaccine 13-valent     Subjective:   Patient presents for Medicare Annual/Subsequent preventive examination.   Review Past Medical/Family/Social: Each of these sections has been reviewed and updated in epic today. Also documented above.   Risk  Factors  Current exercise habits:  He walks 30 minutes every day. Says that if it is raining outside then he will walk in his basement. He also works in his yard and garden. Dietary issues discussed: He already is eating a low sodium low cholesterol low carbohydrate diet.  Cardiac risk factors: Male, age, hypertension  Depression Screen  (Note: if answer to either of the following is "Yes", a more complete depression screening is indicated)  Over the past two weeks, have you felt down, depressed or hopeless? No Over the past two weeks, have you felt little interest or pleasure in doing things? No Have you lost interest or pleasure in daily life? No Do you often feel hopeless? No Do you cry easily over simple problems? No   Activities of Daily Living  In your present state of health, do you have any difficulty performing the following activities?:  Driving? No  Managing money? No  Feeding yourself? No  Getting from bed to chair? No  Climbing a flight of stairs? No  Preparing food and eating?: No  Bathing or showering? No  Getting dressed: No  Getting to the toilet? No  Using the toilet:No  Moving around from place to place: No  In the past year have you fallen or had a near fall?:No  Are you sexually active? No  Do you have more than one partner? No   Hearing Difficulties: No  Do you often ask people to speak up or repeat themselves? Yes Do you experience ringing or noises in your ears? Yes Do you have difficulty understanding soft or whispered voices? Yes Do you feel that you have a problem with memory? No Do you often misplace items? No  Do you feel safe at home? Yes  Cognitive Testing  Alert? Yes Normal Appearance?Yes  Oriented to person? Yes Place? Yes  Time? Yes  Recall of three objects? Yes  Can perform simple calculations? Yes  Displays appropriate judgment?Yes  Can read the correct time from a watch face?Yes   List the Names of Other Physician/Practitioners you  currently use:  He sees dermatologist every 3 months since being diagnosed with melanoma in 2014. He sees ophthalmologist every 6 months. No other medical providers.  Indicate any recent Medical Services you may have received from other than [facility] providers in the past year (date may be approximate).   Screening Tests / Date---see above Colonoscopy                     Zostavax  Mammogram  Influenza Vaccine  Tetanus/tdap    Assessment:    Annual wellness medicare exam   Plan:    During the course of the visit the patient was educated and counseled about appropriate screening and preventive services including:  Screening mammography  Colorectal cancer screening  Shingles vaccine. Prescription given to that she can get the vaccine at the pharmacy or Medicare part D.  Screen + for depression. PHQ- 9 score of 12 (  moderate depression). We discussed the options of counseling versus possibly a medication. I encouraged her strongly think about the counseling. She is going through some medical problems currently and her husband is as well Mrs. been very stressful for her. She says she will think about it. She does have Xanax to use as needed. Though she may benefit from an SSRI for her more depressive type symptoms but she wants to hold off at this time.  I aksed her to please have her cardioloist send records since we have none on file.  Diet review for nutrition referral? Yes ____ Not Indicated __x__  Patient Instructions (the written plan) was given to the patient.  Medicare Attestation  I have personally reviewed:  The patient's medical and social history  Their use of alcohol, tobacco or illicit drugs  Their current medications and supplements  The patient's functional ability including ADLs,fall risks, home safety risks, cognitive, and hearing and visual impairment  Diet and physical activities  Evidence for depression or mood disorders  The patient's weight, height, BMI, and visual  acuity have been recorded in the chart. I have made referrals, counseling, and provided education to the patient based on review of the above and I have provided the patient with a written personalized care plan for preventive services.         Signed:   [address] [place], [place]  01/16/2015 2:14 PM

## 2015-03-01 ENCOUNTER — Other Ambulatory Visit: Payer: Self-pay | Admitting: Physician Assistant

## 2015-03-01 ENCOUNTER — Other Ambulatory Visit: Payer: Self-pay | Admitting: Family Medicine

## 2015-03-01 NOTE — Telephone Encounter (Signed)
Refill appropriate and filled per protocol. 

## 2015-06-04 ENCOUNTER — Other Ambulatory Visit: Payer: Self-pay | Admitting: Physician Assistant

## 2015-06-04 NOTE — Telephone Encounter (Signed)
Medication refilled per protocol. 

## 2015-07-15 ENCOUNTER — Other Ambulatory Visit: Payer: Self-pay | Admitting: Family Medicine

## 2015-07-15 NOTE — Telephone Encounter (Signed)
He is 81 years old and is on blood pressure medicines. Needs to have office visit every 6 months. schedule office visit.

## 2015-07-16 ENCOUNTER — Encounter: Payer: Self-pay | Admitting: Family Medicine

## 2015-07-16 NOTE — Telephone Encounter (Signed)
Medication refill for one time only.  Patient needs to be seen.  Letter sent for patient to call and schedule 

## 2015-07-24 ENCOUNTER — Other Ambulatory Visit: Payer: Self-pay | Admitting: Family Medicine

## 2015-07-24 NOTE — Telephone Encounter (Signed)
Pt does not refills.  Has made follow up appt.

## 2015-07-29 ENCOUNTER — Encounter: Payer: Self-pay | Admitting: Physician Assistant

## 2015-07-29 ENCOUNTER — Ambulatory Visit (INDEPENDENT_AMBULATORY_CARE_PROVIDER_SITE_OTHER): Payer: Medicare Other | Admitting: Physician Assistant

## 2015-07-29 VITALS — BP 142/76 | HR 60 | Temp 98.1°F | Resp 18 | Wt 182.0 lb

## 2015-07-29 DIAGNOSIS — H409 Unspecified glaucoma: Secondary | ICD-10-CM | POA: Diagnosis not present

## 2015-07-29 DIAGNOSIS — I10 Essential (primary) hypertension: Secondary | ICD-10-CM

## 2015-07-29 NOTE — Progress Notes (Signed)
Patient ID: Thomas [name] MRN: [id number], DOB: Jun 05, 1930, 81 y.o. Date of Encounter: @DATE @  Chief Complaint:  Chief Complaint  Patient presents with  . 6 mth check up    HPI: 81 y.o. year old white male  presents for routine office visit. He has no complaints or concerns today. States that he is taking his blood pressure medications as directed. No lower stomach edema. No lightheadedness.   Past Medical History  Diagnosis Date  . Hypertension   . ANEMIA 02/08/2008  . ARTHRITIS 02/08/2008  . 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 ELEVATED PROSTATE SPECIFIC ANTIGEN 02/08/2008  . HISTORY OF ASBESTOS EXPOSURE 02/08/2008  . HYPERLIPIDEMIA 02/08/2008  . HYPOTENSION 02/18/2010  . HYPOTHYROIDISM 02/08/2008  . VITAMIN D DEFICIENCY 02/07/2009  . Melanoma ([place])     left shoulder  . Cataract     left eye  . GLAUCOMA, LEFT EYE 02/07/2009    left eye     Home Meds: Outpatient Prescriptions Prior to Visit  Medication Sig Dispense Refill  . amLODipine (NORVASC) 5 MG tablet TAKE ONE TABLET BY MOUTH ONCE DAILY. 90 tablet 1  . atenolol-chlorthalidone (TENORETIC) 50-25 MG tablet TAKE 1/2 TABLET BY MOUTH EVERY MORNING. 30 tablet 0  . latanoprost (XALATAN) 0.005 % ophthalmic solution 1 drop at bedtime.    . Multiple Vitamins-Minerals (EYE VITAMINS) CAPS Take by mouth.     No facility-administered medications prior to visit.    Allergies:  Allergies  Allergen Reactions  . Penicillins Rash    REACTION: rash    Social History   Social History  . Marital Status: Married    Spouse Name: N/A  . Number of Children: N/A  . Years of Education: N/A   Occupational History  . Not on file.   Social History Main Topics  . Smoking status: Former Smoker    Quit date: 10/08/1971  . Smokeless tobacco: Not on file  . Alcohol Use: Not on file  . Drug Use: Not on file  . Sexual Activity: Not on file   Other Topics Concern  . Not on file   Social History Narrative    Family History  Problem  Relation Age of Onset  . Hypertension Mother   . Heart disease Brother   . Cancer Sister      Review of Systems:  See HPI for pertinent ROS. All other ROS negative.    Physical Exam: Blood pressure 142/76, pulse 60, temperature 98.1 F (36.7 C), temperature source Oral, resp. rate 18, weight 182 lb (82.555 kg)., Body mass index is 27.46 kg/(m^2). General: WNWD WM. Appears in no acute distress. Neck: Supple. No thyromegaly. No lymphadenopathy. No carotid bruits. Lungs: Clear bilaterally to auscultation without wheezes, rales, or rhonchi. Breathing is unlabored. Heart: RRR with S1 S2. No murmurs, rubs, or gallops. Musculoskeletal:  Strength and tone normal for age. Extremities/Skin: Warm and dry. Neuro: Alert and oriented X 3. Moves all extremities spontaneously. Gait is normal. CNII-XII grossly in tact. Psych:  Responds to questions appropriately with a normal affect.     ASSESSMENT AND PLAN:  81 y.o. year old male with  1. Essential hypertension Blood Pressure today is borderline. However I reviewed blood pressure at his last visit here 01/16/15 was 130/60. Will continue current medication. Check lab to monitor. - BASIC METABOLIC PANEL WITH GFR  2. Glaucoma Per Ophthalmology  He had complete physical exam 01/16/15. See that note for preventive care. Discussed influenza vaccine today but he defers.   Ria Clock  [place], [place], B[facility] 07/29/2015 2:21 PM

## 2015-07-30 LAB — BASIC METABOLIC PANEL WITH GFR
BUN: 20 mg/dL (ref 7–25)
CO2: 33 mmol/L — AB (ref 20–31)
Calcium: 9.2 mg/dL (ref 8.6–10.3)
Chloride: 99 mmol/L (ref 98–110)
Creat: 1.14 mg/dL — ABNORMAL HIGH (ref 0.70–1.11)
GFR, EST AFRICAN AMERICAN: 68 mL/min (ref >=60)
GFR, EST NON AFRICAN AMERICAN: 59 mL/min — AB (ref >=60)
Glucose, Bld: 97 mg/dL (ref 70–99)
POTASSIUM: 4.2 mmol/L (ref 3.5–5.3)
Sodium: 139 mmol/L (ref 135–146)

## 2015-07-31 ENCOUNTER — Encounter: Payer: Self-pay | Admitting: Family Medicine

## 2015-09-05 ENCOUNTER — Other Ambulatory Visit: Payer: Self-pay | Admitting: Physician Assistant

## 2015-09-05 NOTE — Telephone Encounter (Signed)
Medication refilled per protocol. 

## 2015-10-15 DIAGNOSIS — Z85820 Personal history of malignant melanoma of skin: Secondary | ICD-10-CM | POA: Diagnosis not present

## 2015-10-15 DIAGNOSIS — X32XXXD Exposure to sunlight, subsequent encounter: Secondary | ICD-10-CM | POA: Diagnosis not present

## 2015-10-15 DIAGNOSIS — Z08 Encounter for follow-up examination after completed treatment for malignant neoplasm: Secondary | ICD-10-CM | POA: Diagnosis not present

## 2015-10-15 DIAGNOSIS — Z1283 Encounter for screening for malignant neoplasm of skin: Secondary | ICD-10-CM | POA: Diagnosis not present

## 2015-10-15 DIAGNOSIS — L570 Actinic keratosis: Secondary | ICD-10-CM | POA: Diagnosis not present

## 2015-12-04 ENCOUNTER — Other Ambulatory Visit: Payer: Self-pay | Admitting: Physician Assistant

## 2015-12-04 NOTE — Telephone Encounter (Signed)
Medication refilled per protocol. 

## 2016-01-14 DIAGNOSIS — L570 Actinic keratosis: Secondary | ICD-10-CM | POA: Diagnosis not present

## 2016-01-14 DIAGNOSIS — Z1283 Encounter for screening for malignant neoplasm of skin: Secondary | ICD-10-CM | POA: Diagnosis not present

## 2016-01-14 DIAGNOSIS — Z85820 Personal history of malignant melanoma of skin: Secondary | ICD-10-CM | POA: Diagnosis not present

## 2016-01-14 DIAGNOSIS — Z08 Encounter for follow-up examination after completed treatment for malignant neoplasm: Secondary | ICD-10-CM | POA: Diagnosis not present

## 2016-01-14 DIAGNOSIS — X32XXXD Exposure to sunlight, subsequent encounter: Secondary | ICD-10-CM | POA: Diagnosis not present

## 2016-01-20 DIAGNOSIS — H401122 Primary open-angle glaucoma, left eye, moderate stage: Secondary | ICD-10-CM | POA: Diagnosis not present

## 2016-01-20 DIAGNOSIS — H353132 Nonexudative age-related macular degeneration, bilateral, intermediate dry stage: Secondary | ICD-10-CM | POA: Diagnosis not present

## 2016-01-20 DIAGNOSIS — H2513 Age-related nuclear cataract, bilateral: Secondary | ICD-10-CM | POA: Diagnosis not present

## 2016-01-23 ENCOUNTER — Other Ambulatory Visit: Payer: Medicare Other

## 2016-01-23 DIAGNOSIS — Z125 Encounter for screening for malignant neoplasm of prostate: Secondary | ICD-10-CM

## 2016-01-23 DIAGNOSIS — I10 Essential (primary) hypertension: Secondary | ICD-10-CM

## 2016-01-23 DIAGNOSIS — Z79899 Other long term (current) drug therapy: Secondary | ICD-10-CM | POA: Diagnosis not present

## 2016-01-23 DIAGNOSIS — Z0000 Encounter for general adult medical examination without abnormal findings: Secondary | ICD-10-CM

## 2016-01-23 LAB — CBC WITH DIFFERENTIAL/PLATELET
BASOS PCT: 1 %
Basophils Absolute: 76 cells/uL (ref 0–200)
Eosinophils Absolute: 152 cells/uL (ref 15–500)
Eosinophils Relative: 2 %
HCT: 39.7 % (ref 38.5–50.0)
HEMOGLOBIN: 13.6 g/dL (ref 13.0–17.0)
LYMPHS ABS: 1748 {cells}/uL (ref 850–3900)
LYMPHS PCT: 23 %
MCH: 31.6 pg (ref 27.0–33.0)
MCHC: 34.3 g/dL (ref 32.0–36.0)
MCV: 92.3 fL (ref 80.0–100.0)
MPV: 9.1 fL (ref 7.5–12.5)
Monocytes Absolute: 684 cells/uL (ref 200–950)
Monocytes Relative: 9 %
Neutro Abs: 4940 cells/uL (ref 1500–7800)
Neutrophils Relative %: 65 %
Platelets: 242 10*3/uL (ref 140–400)
RBC: 4.3 MIL/uL (ref 4.20–5.80)
RDW: 13.2 % (ref 11.0–15.0)
WBC: 7.6 10*3/uL (ref 3.8–10.8)

## 2016-01-23 LAB — COMPLETE METABOLIC PANEL WITH GFR
ALBUMIN: 4.2 g/dL (ref 3.6–5.1)
ALK PHOS: 52 U/L (ref 40–115)
ALT: 10 U/L (ref 9–46)
AST: 23 U/L (ref 10–35)
BUN: 22 mg/dL (ref 7–25)
CALCIUM: 9 mg/dL (ref 8.6–10.3)
CO2: 28 mmol/L (ref 20–31)
CREATININE: 1.04 mg/dL (ref 0.70–1.11)
Chloride: 99 mmol/L (ref 98–110)
GFR, Est African American: 76 mL/min (ref >=60)
GFR, Est Non African American: 66 mL/min (ref >=60)
Glucose, Bld: 85 mg/dL (ref 70–99)
Potassium: 4.3 mmol/L (ref 3.5–5.3)
Sodium: 141 mmol/L (ref 135–146)
TOTAL PROTEIN: 6.9 g/dL (ref 6.1–8.1)
Total Bilirubin: 0.5 mg/dL (ref 0.2–1.2)

## 2016-01-23 LAB — LIPID PANEL
Cholesterol: 180 mg/dL (ref 125–200)
HDL: 61 mg/dL (ref >=40)
LDL Cholesterol: 97 mg/dL (ref <130)
Total CHOL/HDL Ratio: 3 Ratio (ref <=5.0)
Triglycerides: 110 mg/dL (ref <150)
VLDL: 22 mg/dL (ref <30)

## 2016-01-23 LAB — TSH: TSH: 2.38 mIU/L (ref 0.40–4.50)

## 2016-01-24 LAB — PSA, MEDICARE: PSA: 2.01 ng/mL (ref <=4.00)

## 2016-01-27 ENCOUNTER — Ambulatory Visit (INDEPENDENT_AMBULATORY_CARE_PROVIDER_SITE_OTHER): Payer: Medicare Other | Admitting: Physician Assistant

## 2016-01-27 ENCOUNTER — Encounter: Payer: Self-pay | Admitting: Physician Assistant

## 2016-01-27 VITALS — BP 114/66 | HR 60 | Temp 98.0°F | Resp 18 | Ht 68.5 in | Wt 184.0 lb

## 2016-01-27 DIAGNOSIS — Z23 Encounter for immunization: Secondary | ICD-10-CM

## 2016-01-27 DIAGNOSIS — Z0000 Encounter for general adult medical examination without abnormal findings: Secondary | ICD-10-CM | POA: Diagnosis not present

## 2016-01-27 NOTE — Progress Notes (Signed)
Patient ID: Thomas [name] MRN: [id number], DOB: 1930-02-22 82 y.o. Date of Encounter: 01/27/2016, 11:52 AM    Chief Complaint: Physical (CPE)  HPI: 82 y.o. y/o white male here for CPE.  01/16/2015: He also presents as a new patient to establish care with our practice.  He states that he was seeing Dr. Sherren Mocha with [facility] but that he is retiring. Says that his last visit with him was April 2015.  Also, patient's wife became a patient with her office about a year ago.  He says that recently he had been feeling some discomfort in his left low back. Says that he would feel it when he was bent forward to do the wash the dishes. Also would feel it when he walk. Says that he  never felt any discomfort when he was sitting. He says that actually this has been a lot better over the past week. Says that over the past week he has been busy lifting bags of concrete etc. and doesn't know if this is had any effect on it but says that the discomfort is actually been better. Has had no pain numbness or tingling down the leg or foot and no weakness in the leg or foot.  No other complaints or concerns today.  He said sees a dermatologist every 3 months since he was diagnosed with melanoma in 2014. He sees his ophthalmologist every 6 months. States that he sees no other medical providers on a routine basis.  07/2015: He had routine OV to f/u HTN.   01/27/2016: Today presents for CPE.  No compliant/ concerns.  Says he stays active. No angina symptoms even with this exertion.   Says he still sees dermatologist every 3 months since he was diagnosed with melanoma in 2014. He sees his ophthalmologist every 6 months. Says he is going to have left eye cataract removed.  States that he is still just seeing these specialists--    Review of Systems: Consitutional: No fever, chills, fatigue, night sweats, lymphadenopathy, or weight changes. Eyes: No visual changes, eye redness, or  discharge. ENT/Mouth: Ears: No otalgia, tinnitus, hearing loss, discharge. Nose: No congestion, rhinorrhea, sinus pain, or epistaxis. Throat: No sore throat, post nasal drip, or teeth pain. Cardiovascular: No CP, palpitations, diaphoresis, DOE, edema, orthopnea, PND. Respiratory: No cough, hemoptysis, SOB, or wheezing. Gastrointestinal: No anorexia, dysphagia, reflux, pain, nausea, vomiting, hematemesis, diarrhea, constipation, BRBPR, or melena. Genitourinary: No dysuria, frequency, urgency, hematuria, incontinence, nocturia, decreased urinary stream, discharge, impotence, or testicular pain/masses. Musculoskeletal: No decreased ROM, myalgias, stiffness, joint swelling, or weakness. Skin: No rash, erythema, lesion changes, pain, warmth, jaundice, or pruritis. Neurological: No headache, dizziness, syncope, seizures, tremors, memory loss, coordination problems, or paresthesias. Psychological: No anxiety, depression, hallucinations, SI/HI. Endocrine: No fatigue, polydipsia, polyphagia, polyuria, or known diabetes. All other systems were reviewed and are otherwise negative.  Past Medical History  Diagnosis Date  . Hypertension   . ANEMIA 02/08/2008  . ARTHRITIS 02/08/2008  . 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 ELEVATED PROSTATE SPECIFIC ANTIGEN 02/08/2008  . HISTORY OF ASBESTOS EXPOSURE 02/08/2008  . HYPERLIPIDEMIA 02/08/2008  . HYPOTENSION 02/18/2010  . HYPOTHYROIDISM 02/08/2008  . VITAMIN D DEFICIENCY 02/07/2009  . Melanoma ([place])     left shoulder  . Cataract     left eye  . GLAUCOMA, LEFT EYE 02/07/2009    left eye     Past Surgical History  Procedure Laterality Date  . Dupyreenes contractures-bilat      Home Meds:  Outpatient Prescriptions  Prior to Visit  Medication Sig Dispense Refill  . amLODipine (NORVASC) 5 MG tablet TAKE ONE TABLET BY MOUTH ONCE DAILY. 90 tablet 0  . atenolol-chlorthalidone (TENORETIC) 50-25 MG tablet TAKE 1/2 TABLET BY MOUTH EVERY MORNING. 90 tablet 1  . latanoprost  (XALATAN) 0.005 % ophthalmic solution 1 drop at bedtime.    . Multiple Vitamins-Minerals (EYE VITAMINS) CAPS Take by mouth.     No facility-administered medications prior to visit.    Allergies:  Allergies  Allergen Reactions  . Penicillins Rash    REACTION: rash    Social History   Social History  . Marital Status: Married    Spouse Name: N/A  . Number of Children: N/A  . Years of Education: N/A   Occupational History  . Not on file.   Social History Main Topics  . Smoking status: Former Smoker    Quit date: 10/08/1971  . Smokeless tobacco: Never Used  . Alcohol Use: No  . Drug Use: No  . Sexual Activity: Not Currently   Other Topics Concern  . Not on file   Social History Narrative    Family History  Problem Relation Age of Onset  . Hypertension Mother   . Heart disease Brother   . Cancer Sister     Physical Exam: Blood pressure 114/66, pulse 60, temperature 98 F (36.7 C), temperature source Oral, resp. rate 18, height 5' 8.5" (1.74 m), weight 184 lb (83.462 kg).  General: Well developed, well nourished, WM. Appears in no acute distress. HEENT: Normocephalic, atraumatic. Conjunctiva pink, sclera non-icteric. Pupils 2 mm constricting to 1 mm, round, regular, and equally reactive to light and accomodation. EOMI. Internal auditory canal clear. TMs with good cone of light and without pathology. Nasal mucosa pink. Nares are without discharge. No sinus tenderness. Oral mucosa pink. Pharynx without exudate.  He wears a hearing aid in the right ear.  Neck: Supple. Trachea midline. No thyromegaly. Full ROM. No lymphadenopathy. Lungs: Clear to auscultation bilaterally without wheezes, rales, or rhonchi. Breathing is of normal effort and unlabored. Cardiovascular: RRR with S1 S2. No murmurs, rubs, or gallops. Distal pulses 2+ symmetrically. No carotid or abdominal bruits. Abdomen: Soft, non-tender, non-distended with normoactive bowel sounds. No hepatosplenomegaly or  masses. No rebound/guarding. No CVA tenderness. No hernias. Musculoskeletal: Full range of motion and 5/5 strength throughout. Without swelling, atrophy, tenderness, crepitus, or warmth. Extremities without clubbing, cyanosis, or edema.  Skin: Warm and moist without erythema, ecchymosis, wounds, or rash. Neuro: A+Ox3. CN II-XII grossly intact. Moves all extremities spontaneously. Full sensation throughout. Normal gait. DTR 2+ throughout upper and lower extremities. Finger to nose intact. Psych:  Responds to questions appropriately with a normal affect.   Assessment/Plan:  82 y.o. y/o white male here for CPE  -1. Visit for preventive health examination  A. Screening Labs: He came and had labs drawn 01/23/16---Reviewed results with him today---all labs excellent.  - CBC with Differential/Platelet - COMPLETE METABOLIC PANEL WITH GFR - Lipid panel - TSH -PSA was also normal.   B. Screening For Prostate Cancer: PSA normal.   C. Screening For Colorectal Cancer:  Given age of 38, no longer needs screening.  D. Immunizations: Flu-------------N/A Tetanus------Td 02/07/2009 Pneumococcal----when I reviewed the immunizations section of epic, I saw documented " Pneumovax 23" but no date listed next to it. I assumed that he had received Pneumovax 23 in the past but the date was just unknown.  I assumed he had received the Pneumovax 23 in the past but had not  received Prevnar 13. I then discussed with the patient-- administering the Prevnar 13 today-- and he was agreeable. Therefore I told our staff to give him the Prevnar 13. However, after the fact----staff informed me that in another section of epic they saw documented "Pneumovax 23 declined". If this is the case, and he had not received Pneumovax 23 in the past,  then [place] 23 IN 6 - 12 MONTHS. Pneumovax 23--given here 01/27/2016  Zostavax--- will discuss this at next visit.   2. Essential hypertension Blood pressure  well controlled. Continue current medications. Lab normal.   3. Glaucoma Per Ophthalmology      Subjective:   Patient presents for Medicare Annual/Subsequent preventive examination.   Review Past Medical/Family/Social: Each of these sections has been reviewed and updated in epic today. Also documented above.   Risk Factors  Current exercise habits:  He walks 30 minutes every day. Says that if it is raining outside then he will walk in his basement. He also works in his yard and garden. Dietary issues discussed: He already is eating a low sodium low cholesterol low carbohydrate diet.  Cardiac risk factors: Male, age, hypertension  Depression Screen  (Note: if answer to either of the following is "Yes", a more complete depression screening is indicated)  Over the past two weeks, have you felt down, depressed or hopeless? No Over the past two weeks, have you felt little interest or pleasure in doing things? No Have you lost interest or pleasure in daily life? No Do you often feel hopeless? No Do you cry easily over simple problems? No   Activities of Daily Living  In your present state of health, do you have any difficulty performing the following activities?:  Driving? No  Managing money? No  Feeding yourself? No  Getting from bed to chair? No  Climbing a flight of stairs? No  Preparing food and eating?: No  Bathing or showering? No  Getting dressed: No  Getting to the toilet? No  Using the toilet:No  Moving around from place to place: No  In the past year have you fallen or had a near fall?:No  Are you sexually active? No  Do you have more than one partner? No   Hearing Difficulties: No  Do you often ask people to speak up or repeat themselves? Yes Do you experience ringing or noises in your ears? Yes Do you have difficulty understanding soft or whispered voices? Yes Do you feel that you have a problem with memory? No Do you often misplace items? No  Do you feel  safe at home? Yes  Cognitive Testing  Alert? Yes Normal Appearance?Yes  Oriented to person? Yes Place? Yes  Time? Yes  Recall of three objects? Yes  Can perform simple calculations? Yes  Displays appropriate judgment?Yes  Can read the correct time from a watch face?Yes   List the Names of Other Physician/Practitioners you currently use:  He sees dermatologist every 3 months since being diagnosed with melanoma in 2014. He sees ophthalmologist every 6 months. No other medical providers.  Indicate any recent Medical Services you may have received from other than [facility] providers in the past year (date may be approximate).   Screening Tests / Date---see above Colonoscopy                     Zostavax  Mammogram  Influenza Vaccine  Tetanus/tdap    Assessment:    Annual wellness medicare exam  Plan:    During the course of the visit the patient was educated and counseled about appropriate screening and preventive services including:  Screening mammography  Colorectal cancer screening  Shingles vaccine. Prescription given to that she can get the vaccine at the pharmacy or Medicare part D.  Screen + for depression. PHQ- 9 score of 12 (moderate depression). We discussed the options of counseling versus possibly a medication. I encouraged her strongly think about the counseling. She is going through some medical problems currently and her husband is as well Mrs. been very stressful for her. She says she will think about it. She does have Xanax to use as needed. Though she may benefit from an SSRI for her more depressive type symptoms but she wants to hold off at this time.  I aksed her to please have her cardioloist send records since we have none on file.  Diet review for nutrition referral? Yes ____ Not Indicated __x__  Patient Instructions (the written plan) was given to the patient.  Medicare Attestation  I have personally reviewed:  The patient's medical and social history  Their  use of alcohol, tobacco or illicit drugs  Their current medications and supplements  The patient's functional ability including ADLs,fall risks, home safety risks, cognitive, and hearing and visual impairment  Diet and physical activities  Evidence for depression or mood disorders  The patient's weight, height, BMI, and visual acuity have been recorded in the chart. I have made referrals, counseling, and provided education to the patient based on review of the above and I have provided the patient with a written personalized care plan for preventive services.         Signed:   [address] [place], [place]  01/27/2016 11:52 AM

## 2016-02-13 DIAGNOSIS — H25812 Combined forms of age-related cataract, left eye: Secondary | ICD-10-CM | POA: Diagnosis not present

## 2016-02-13 DIAGNOSIS — H2512 Age-related nuclear cataract, left eye: Secondary | ICD-10-CM | POA: Diagnosis not present

## 2016-03-09 ENCOUNTER — Telehealth: Payer: Self-pay | Admitting: Physician Assistant

## 2016-03-09 MED ORDER — AMLODIPINE BESYLATE 5 MG PO TABS: 5.0000 mg | ORAL_TABLET | Freq: Every day | ORAL | Status: DC

## 2016-03-09 MED ORDER — ATENOLOL-CHLORTHALIDONE 50-25 MG PO TABS: ORAL_TABLET | ORAL | Status: DC

## 2016-03-09 NOTE — Telephone Encounter (Signed)
Pt needs a refill of Amlodipine 5 mg sent to [facility]

## 2016-03-09 NOTE — Telephone Encounter (Signed)
Prescription sent to pharmacy.

## 2016-04-15 DIAGNOSIS — Z85820 Personal history of malignant melanoma of skin: Secondary | ICD-10-CM | POA: Diagnosis not present

## 2016-04-15 DIAGNOSIS — Z1283 Encounter for screening for malignant neoplasm of skin: Secondary | ICD-10-CM | POA: Diagnosis not present

## 2016-04-15 DIAGNOSIS — X32XXXD Exposure to sunlight, subsequent encounter: Secondary | ICD-10-CM | POA: Diagnosis not present

## 2016-04-15 DIAGNOSIS — L570 Actinic keratosis: Secondary | ICD-10-CM | POA: Diagnosis not present

## 2016-04-15 DIAGNOSIS — Z08 Encounter for follow-up examination after completed treatment for malignant neoplasm: Secondary | ICD-10-CM | POA: Diagnosis not present

## 2016-06-05 ENCOUNTER — Other Ambulatory Visit: Payer: Self-pay | Admitting: Physician Assistant

## 2016-07-14 DIAGNOSIS — Z08 Encounter for follow-up examination after completed treatment for malignant neoplasm: Secondary | ICD-10-CM | POA: Diagnosis not present

## 2016-07-14 DIAGNOSIS — Z85820 Personal history of malignant melanoma of skin: Secondary | ICD-10-CM | POA: Diagnosis not present

## 2016-07-14 DIAGNOSIS — X32XXXD Exposure to sunlight, subsequent encounter: Secondary | ICD-10-CM | POA: Diagnosis not present

## 2016-07-14 DIAGNOSIS — Z1283 Encounter for screening for malignant neoplasm of skin: Secondary | ICD-10-CM | POA: Diagnosis not present

## 2016-07-14 DIAGNOSIS — L570 Actinic keratosis: Secondary | ICD-10-CM | POA: Diagnosis not present

## 2016-08-03 ENCOUNTER — Ambulatory Visit (INDEPENDENT_AMBULATORY_CARE_PROVIDER_SITE_OTHER): Payer: Medicare Other | Admitting: Physician Assistant

## 2016-08-03 ENCOUNTER — Encounter: Payer: Self-pay | Admitting: Physician Assistant

## 2016-08-03 VITALS — BP 108/60 | HR 55 | Temp 98.3°F | Resp 16 | Wt 176.0 lb

## 2016-08-03 DIAGNOSIS — B9689 Other specified bacterial agents as the cause of diseases classified elsewhere: Secondary | ICD-10-CM

## 2016-08-03 DIAGNOSIS — J988 Other specified respiratory disorders: Secondary | ICD-10-CM | POA: Diagnosis not present

## 2016-08-03 DIAGNOSIS — I10 Essential (primary) hypertension: Secondary | ICD-10-CM | POA: Diagnosis not present

## 2016-08-03 LAB — BASIC METABOLIC PANEL WITH GFR
BUN: 17 mg/dL (ref 7–25)
CALCIUM: 9.3 mg/dL (ref 8.6–10.3)
CO2: 32 mmol/L — ABNORMAL HIGH (ref 20–31)
CREATININE: 1.08 mg/dL (ref 0.70–1.11)
Chloride: 99 mmol/L (ref 98–110)
GFR, EST AFRICAN AMERICAN: 72 mL/min (ref >=60)
GFR, Est Non African American: 63 mL/min (ref >=60)
Glucose, Bld: 100 mg/dL — ABNORMAL HIGH (ref 70–99)
Potassium: 4.3 mmol/L (ref 3.5–5.3)
Sodium: 141 mmol/L (ref 135–146)

## 2016-08-03 MED ORDER — AZITHROMYCIN 250 MG PO TABS: ORAL_TABLET | ORAL | 0 refills | Status: DC

## 2016-08-03 NOTE — Progress Notes (Signed)
Patient ID: Thomas [name] MRN: [id number], DOB: 10-24-1929, 82 y.o. Date of Encounter: @DATE @  Chief Complaint:  Chief Complaint  Patient presents with  . Hypertension    follow up    HPI: 82 y.o. year old male  presents Routine follow-up visit.  He is taking his blood pressure medications as directed. Having no lightheadedness. No other adverse effects.  He states that he has been feeling really well except for having drainage for about a week or so now. Says that he feels a lot of drainage in his throat and coughs up phlegm that is very thick and dark. Also is blowing his nose and getting out thick dark mucus. Has had no fevers or chills. No sore throat.  No other complaints or concerns today.   Past Medical History:  Diagnosis Date  . ANEMIA 02/08/2008  . ARTHRITIS 02/08/2008  . Cataract    left eye  . 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 ELEVATED PROSTATE SPECIFIC ANTIGEN 02/08/2008  . GLAUCOMA, LEFT EYE 02/07/2009   left eye  . HISTORY OF ASBESTOS EXPOSURE 02/08/2008  . HYPERLIPIDEMIA 02/08/2008  . Hypertension   . HYPOTENSION 02/18/2010  . HYPOTHYROIDISM 02/08/2008  . Melanoma ([place])    left shoulder  . VITAMIN D DEFICIENCY 02/07/2009     Home Meds: Outpatient Medications Prior to Visit  Medication Sig Dispense Refill  . amLODipine (NORVASC) 5 MG tablet TAKE ONE TABLET BY MOUTH ONCE DAILY. 90 tablet 0  . atenolol-chlorthalidone (TENORETIC) 50-25 MG tablet TAKE 1/2 TABLET BY MOUTH EVERY MORNING. 90 tablet 1  . latanoprost (XALATAN) 0.005 % ophthalmic solution 1 drop at bedtime.    . Multiple Vitamins-Minerals (EYE VITAMINS) CAPS Take by mouth.     No facility-administered medications prior to visit.     Allergies:  Allergies  Allergen Reactions  . Penicillins Rash    REACTION: rash    Social History   Social History  . Marital status: Married    Spouse name: N/A  . Number of children: N/A  . Years of education: N/A   Occupational History  . Not on file.    Social History Main Topics  . Smoking status: Former Smoker    Quit date: 10/08/1971  . Smokeless tobacco: Never Used  . Alcohol use No  . Drug use: No  . Sexual activity: Not Currently   Other Topics Concern  . Not on file   Social History Narrative  . No narrative on file    Family History  Problem Relation Age of Onset  . Hypertension Mother   . Heart disease Brother   . Cancer Sister      Review of Systems:  See HPI for pertinent ROS. All other ROS negative.    Physical Exam: Blood pressure 108/60, pulse (!) 55, temperature 98.3 F (36.8 C), temperature source Oral, resp. rate 16, weight 176 lb (79.8 kg), SpO2 99 %., Body mass index is 26.37 kg/m. General: WNWD WM. Appears in no acute distress. Head: Normocephalic, atraumatic, eyes without discharge, sclera non-icteric, nares are without discharge. Bilateral auditory canals clear, TM's are without perforation, pearly grey and translucent with reflective cone of light bilaterally. Oral cavity moist, posterior pharynx without exudate, erythema, peritonsillar abscess.  Neck: Supple. No thyromegaly. No lymphadenopathy. No carotid bruits. Lungs: Clear bilaterally to auscultation without wheezes, rales, or rhonchi. Breathing is unlabored. Heart: RRR with S1 S2. No murmurs, rubs, or gallops. Musculoskeletal:  Strength and tone normal for age. Extremities/Skin: Warm and dry.  No edema. Neuro: Alert and oriented X 3. Moves all extremities spontaneously. Gait is normal. CNII-XII grossly in tact. Psych:  Responds to questions appropriately with a normal affect.     ASSESSMENT AND PLAN:  82 y.o. year old male with  1. Essential hypertension Blood pressure is well controlled/at goal. Is actually on the low side today he states he has not been having any lightheadedness. Continue current medications. Check lab to monitor. - BASIC METABOLIC PANEL WITH GFR  2. Bacterial respiratory infection He is to take antibiotic as  directed. If symptoms do not resolve within 1 week after completion of antibiotic and follow-up. - azithromycin (ZITHROMAX) 250 MG tablet; Day 1: Take 2 daily. Days 2-5: Take 1 daily.  Dispense: 6 tablet; Refill: 0   Signed, [address] [place], [place], [facility] 08/03/2016 11:45 AM

## 2016-09-08 ENCOUNTER — Other Ambulatory Visit: Payer: Self-pay | Admitting: Physician Assistant

## 2016-10-21 DIAGNOSIS — Z1283 Encounter for screening for malignant neoplasm of skin: Secondary | ICD-10-CM | POA: Diagnosis not present

## 2016-10-21 DIAGNOSIS — Z08 Encounter for follow-up examination after completed treatment for malignant neoplasm: Secondary | ICD-10-CM | POA: Diagnosis not present

## 2016-10-21 DIAGNOSIS — L218 Other seborrheic dermatitis: Secondary | ICD-10-CM | POA: Diagnosis not present

## 2016-10-21 DIAGNOSIS — Z85820 Personal history of malignant melanoma of skin: Secondary | ICD-10-CM | POA: Diagnosis not present

## 2016-12-07 ENCOUNTER — Other Ambulatory Visit: Payer: Self-pay | Admitting: Physician Assistant

## 2016-12-07 NOTE — Telephone Encounter (Signed)
Refill appropriate 

## 2017-01-19 DIAGNOSIS — Z1283 Encounter for screening for malignant neoplasm of skin: Secondary | ICD-10-CM | POA: Diagnosis not present

## 2017-01-19 DIAGNOSIS — Z85820 Personal history of malignant melanoma of skin: Secondary | ICD-10-CM | POA: Diagnosis not present

## 2017-01-19 DIAGNOSIS — Z08 Encounter for follow-up examination after completed treatment for malignant neoplasm: Secondary | ICD-10-CM | POA: Diagnosis not present

## 2017-01-19 DIAGNOSIS — L570 Actinic keratosis: Secondary | ICD-10-CM | POA: Diagnosis not present

## 2017-02-03 ENCOUNTER — Ambulatory Visit (INDEPENDENT_AMBULATORY_CARE_PROVIDER_SITE_OTHER): Payer: Medicare Other | Admitting: Physician Assistant

## 2017-02-03 ENCOUNTER — Encounter: Payer: Self-pay | Admitting: Physician Assistant

## 2017-02-03 VITALS — BP 110/60 | HR 60 | Temp 98.1°F | Resp 14 | Wt 174.4 lb

## 2017-02-03 DIAGNOSIS — I10 Essential (primary) hypertension: Secondary | ICD-10-CM

## 2017-02-03 LAB — BASIC METABOLIC PANEL WITH GFR
BUN: 16 mg/dL (ref 7–25)
CO2: 29 mmol/L (ref 20–31)
Calcium: 9.5 mg/dL (ref 8.6–10.3)
Chloride: 100 mmol/L (ref 98–110)
Creat: 1.17 mg/dL — ABNORMAL HIGH (ref 0.70–1.11)
GFR, EST AFRICAN AMERICAN: 65 mL/min (ref >=60)
GFR, EST NON AFRICAN AMERICAN: 57 mL/min — AB (ref >=60)
Glucose, Bld: 101 mg/dL — ABNORMAL HIGH (ref 70–99)
Potassium: 4.1 mmol/L (ref 3.5–5.3)
Sodium: 140 mmol/L (ref 135–146)

## 2017-02-03 NOTE — Progress Notes (Signed)
    Patient ID: Thomas [name] MRN: [id number], DOB: 29-Mar-1930, 83 y.o. Date of Encounter: 02/03/2017, 12:02 PM    Chief Complaint:  Chief Complaint  Patient presents with  . 6 month routine follow up     HPI: 83 y.o. year old male is here for routine f/u OV.   His wife is also here for her routine follow-up visit. She has significant dementia and he is her primary caregiver. However today he reports that her health is stable and he reports that he has been feeling pretty good. He states he has no specific concerns that he needs to address for himself. They do have 2 daughters who live nearby and do check on them and helps them.  He is taking blood pressure medications as directed and is having no adverse effects. Noted that his blood pressure is reading on the low side today but he reports that he is having no lightheadedness and no presyncope.     Home Meds:   Outpatient Medications Prior to Visit  Medication Sig Dispense Refill  . amLODipine (NORVASC) 5 MG tablet TAKE ONE TABLET BY MOUTH ONCE DAILY. 90 tablet 0  . atenolol-chlorthalidone (TENORETIC) 50-25 MG tablet TAKE 1/2 TABLET BY MOUTH EVERY MORNING. 90 tablet 1  . azithromycin (ZITHROMAX) 250 MG tablet Day 1: Take 2 daily. Days 2-5: Take 1 daily. 6 tablet 0  . latanoprost (XALATAN) 0.005 % ophthalmic solution 1 drop at bedtime.    . Multiple Vitamins-Minerals (EYE VITAMINS) CAPS Take by mouth.     No facility-administered medications prior to visit.     Allergies:  Allergies  Allergen Reactions  . Penicillins Rash    REACTION: rash      Review of Systems: See HPI for pertinent ROS. All other ROS negative.    Physical Exam: Blood pressure 110/60, pulse 60, temperature 98.1 F (36.7 C), temperature source Oral, resp. rate 14, weight 174 lb 6.4 oz (79.1 kg), SpO2 98 %., Body mass index is 26.13 kg/m. General:  WNWD WM. Appears in no acute distress. Neck: Supple. No thyromegaly. No lymphadenopathy. No carotid  bruit. Lungs: Clear bilaterally to auscultation without wheezes, rales, or rhonchi. Breathing is unlabored. Heart: Regular rhythm. No murmurs, rubs, or gallops. Msk:  Strength and tone normal for age. Extremities/Skin: Warm and dry.  No LE edema.  Neuro: Alert and oriented X 3. Moves all extremities spontaneously. Gait is normal. CNII-XII grossly in tact. Psych:  Responds to questions appropriately with a normal affect.     ASSESSMENT AND PLAN:  83 y.o. year old male with  1. Essential hypertension Blood pressure is on the low side but he is having no lightheadedness and no presyncope. If blood pressure remains this low at follow-up visit then will decrease medicines. At this time will continue medicines the same and will check lab to monitor. - BASIC METABOLIC PANEL WITH GFR  Routine office visit 6 months or sooner if needed.  [address] [place], [place], [facility] 02/03/2017 12:02 PM

## 2017-03-13 ENCOUNTER — Other Ambulatory Visit: Payer: Self-pay | Admitting: Physician Assistant

## 2017-03-18 DIAGNOSIS — H353132 Nonexudative age-related macular degeneration, bilateral, intermediate dry stage: Secondary | ICD-10-CM | POA: Diagnosis not present

## 2017-03-18 DIAGNOSIS — H2511 Age-related nuclear cataract, right eye: Secondary | ICD-10-CM | POA: Diagnosis not present

## 2017-03-18 DIAGNOSIS — H401122 Primary open-angle glaucoma, left eye, moderate stage: Secondary | ICD-10-CM | POA: Diagnosis not present

## 2017-03-25 ENCOUNTER — Other Ambulatory Visit: Payer: Self-pay | Admitting: Physician Assistant

## 2017-04-20 DIAGNOSIS — Z85820 Personal history of malignant melanoma of skin: Secondary | ICD-10-CM | POA: Diagnosis not present

## 2017-04-20 DIAGNOSIS — Z1283 Encounter for screening for malignant neoplasm of skin: Secondary | ICD-10-CM | POA: Diagnosis not present

## 2017-04-20 DIAGNOSIS — Z08 Encounter for follow-up examination after completed treatment for malignant neoplasm: Secondary | ICD-10-CM | POA: Diagnosis not present

## 2017-06-15 ENCOUNTER — Other Ambulatory Visit: Payer: Self-pay | Admitting: Physician Assistant

## 2017-07-27 DIAGNOSIS — Z08 Encounter for follow-up examination after completed treatment for malignant neoplasm: Secondary | ICD-10-CM | POA: Diagnosis not present

## 2017-07-27 DIAGNOSIS — Z85820 Personal history of malignant melanoma of skin: Secondary | ICD-10-CM | POA: Diagnosis not present

## 2017-07-27 DIAGNOSIS — Z1283 Encounter for screening for malignant neoplasm of skin: Secondary | ICD-10-CM | POA: Diagnosis not present

## 2017-08-04 ENCOUNTER — Encounter: Payer: Self-pay | Admitting: Physician Assistant

## 2017-08-04 ENCOUNTER — Other Ambulatory Visit: Payer: Self-pay

## 2017-08-04 ENCOUNTER — Ambulatory Visit: Payer: Medicare Other | Admitting: Physician Assistant

## 2017-08-04 VITALS — BP 102/70 | HR 61 | Temp 97.6°F | Resp 16 | Ht 70.0 in | Wt 171.8 lb

## 2017-08-04 DIAGNOSIS — I10 Essential (primary) hypertension: Secondary | ICD-10-CM | POA: Diagnosis not present

## 2017-08-04 DIAGNOSIS — Z23 Encounter for immunization: Secondary | ICD-10-CM

## 2017-08-04 NOTE — Progress Notes (Signed)
    Patient ID: LINDA BIEHN MRN: [id number], DOB: 05-Jul-1930, 83 y.o. Date of Encounter: 08/04/2017, 11:53 AM    Chief Complaint:  Chief Complaint  Patient presents with  . routine office follow up     HPI: 83 y.o. year old male is here for routine f/u OV.   His wife is also here for her routine follow-up visit. She has significant dementia and he is her primary caregiver. However today he reports that her health is stable and he reports that he has been feeling pretty good. He states he has no specific concerns that he needs to address for himself. They do have 2 daughters who live nearby and do check on them and helps them.  He is taking blood pressure medications as directed and is having no adverse effects. Noted that his blood pressure is reading on the low side today but he reports that he is having no lightheadedness and no presyncope.     Home Meds:   Outpatient Medications Prior to Visit  Medication Sig Dispense Refill  . amLODipine (NORVASC) 5 MG tablet TAKE ONE TABLET BY MOUTH ONCE DAILY. 90 tablet 0  . atenolol-chlorthalidone (TENORETIC) 50-25 MG tablet TAKE 1/2 TABLET BY MOUTH EVERY MORNING. 90 tablet 0  . latanoprost (XALATAN) 0.005 % ophthalmic solution 1 drop at bedtime.    . Multiple Vitamins-Minerals (EYE VITAMINS) CAPS Take by mouth.    Marland Kitchen azithromycin (ZITHROMAX) 250 MG tablet Day 1: Take 2 daily. Days 2-5: Take 1 daily. 6 tablet 0   No facility-administered medications prior to visit.     Allergies:  Allergies  Allergen Reactions  . Penicillins Rash    REACTION: rash      Review of Systems: See HPI for pertinent ROS. All other ROS negative.    Physical Exam: Blood pressure 102/70, pulse 61, temperature 97.6 F (36.4 C), temperature source Oral, resp. rate 16, height 5\' 10"  (1.778 m), weight 77.9 kg (171 lb 12.8 oz), SpO2 98 %., Body mass index is 24.65 kg/m. General:  WNWD WM. Appears in no acute distress. Neck: Supple. No thyromegaly. No  lymphadenopathy. No carotid bruit. Lungs: Clear bilaterally to auscultation without wheezes, rales, or rhonchi. Breathing is unlabored. Heart: Regular rhythm. No murmurs, rubs, or gallops. Msk:  Strength and tone normal for age. Extremities/Skin: Warm and dry.  No LE edema.  Neuro: Alert and oriented X 3. Moves all extremities spontaneously. Gait is normal. CNII-XII grossly in tact. Psych:  Responds to questions appropriately with a normal affect.     ASSESSMENT AND PLAN:  83 y.o. year old male with  1. Essential hypertension Blood pressure is on the low side but he is having no lightheadedness and no presyncope. If blood pressure remains this low at follow-up visit then will decrease medicines. At this time will continue medicines the same and will check lab to monitor. - BASIC METABOLIC PANEL WITH GFR  He is agreeable to get Flu vaccine today.   Routine office visit 6 months or sooner if needed.  Signed, [address] [place], [place], [facility] 08/04/2017 11:53 AM

## 2017-08-04 NOTE — Addendum Note (Signed)
Addended by: Vonna Kotyk A on: 08/04/2017 12:58 PM   Modules accepted: Orders

## 2017-08-05 LAB — BASIC METABOLIC PANEL WITH GFR
BUN: 17 mg/dL (ref 7–25)
CO2: 30 mmol/L (ref 20–32)
CREATININE: 1.1 mg/dL (ref 0.70–1.11)
Calcium: 9.2 mg/dL (ref 8.6–10.3)
Chloride: 97 mmol/L — ABNORMAL LOW (ref 98–110)
GFR, Est African American: 71 mL/min/{1.73_m2} (ref >=60)
GFR, Est Non African American: 61 mL/min/{1.73_m2} (ref >=60)
GLUCOSE: 84 mg/dL (ref 65–99)
POTASSIUM: 3.8 mmol/L (ref 3.5–5.3)
Sodium: 139 mmol/L (ref 135–146)

## 2017-09-14 DIAGNOSIS — H02055 Trichiasis without entropian left lower eyelid: Secondary | ICD-10-CM | POA: Diagnosis not present

## 2017-09-14 DIAGNOSIS — H26492 Other secondary cataract, left eye: Secondary | ICD-10-CM | POA: Diagnosis not present

## 2017-09-14 DIAGNOSIS — H353232 Exudative age-related macular degeneration, bilateral, with inactive choroidal neovascularization: Secondary | ICD-10-CM | POA: Diagnosis not present

## 2017-09-14 DIAGNOSIS — H401122 Primary open-angle glaucoma, left eye, moderate stage: Secondary | ICD-10-CM | POA: Diagnosis not present

## 2017-09-21 ENCOUNTER — Other Ambulatory Visit: Payer: Self-pay | Admitting: Physician Assistant

## 2017-10-21 DIAGNOSIS — H26492 Other secondary cataract, left eye: Secondary | ICD-10-CM | POA: Diagnosis not present

## 2017-12-20 DIAGNOSIS — B029 Zoster without complications: Secondary | ICD-10-CM | POA: Diagnosis not present

## 2017-12-27 ENCOUNTER — Other Ambulatory Visit: Payer: Self-pay | Admitting: Physician Assistant

## 2018-01-25 DIAGNOSIS — B029 Zoster without complications: Secondary | ICD-10-CM | POA: Diagnosis not present

## 2018-01-25 DIAGNOSIS — Z08 Encounter for follow-up examination after completed treatment for malignant neoplasm: Secondary | ICD-10-CM | POA: Diagnosis not present

## 2018-01-25 DIAGNOSIS — Z1283 Encounter for screening for malignant neoplasm of skin: Secondary | ICD-10-CM | POA: Diagnosis not present

## 2018-01-25 DIAGNOSIS — Z85820 Personal history of malignant melanoma of skin: Secondary | ICD-10-CM | POA: Diagnosis not present

## 2018-02-02 ENCOUNTER — Encounter: Payer: Self-pay | Admitting: Physician Assistant

## 2018-02-02 ENCOUNTER — Other Ambulatory Visit: Payer: Self-pay

## 2018-02-02 ENCOUNTER — Ambulatory Visit: Payer: Medicare Other | Admitting: Physician Assistant

## 2018-02-02 VITALS — BP 118/64 | HR 63 | Temp 98.0°F | Resp 14 | Ht 70.0 in | Wt 174.4 lb

## 2018-02-02 DIAGNOSIS — I10 Essential (primary) hypertension: Secondary | ICD-10-CM

## 2018-02-02 NOTE — Progress Notes (Signed)
    Patient ID: Thomas [name] MRN: [id number], DOB: 03/22/1930, 84 y.o. Date of Encounter: 02/02/2018, 11:47 AM    Chief Complaint:  Chief Complaint  Patient presents with  . follow up with blood pressure     HPI: 84 y.o. year old male is here for routine f/u OV.   His wife is also here for her routine follow-up visit. She has significant dementia and he is her primary caregiver. However today he reports that her health is stable and he reports that he has been feeling pretty good.  They do have 2 daughters who live nearby and they help them. He is taking blood pressure medications as directed and is having no lightheadedness and no adverse effects.    Reports that he recently did develop shingles 2 or 3 weeks ago.  Says that he saw [employer] PA with Dr. Nevada Crane dermatology.  He does not remember which medication she prescribed him/what medication he took.  However says that the shingles has not resolved.  I have told him to go home today and call Dr. Juel Burrow office for follow-up management.  Gust that I do manage shingles here but I do not know what medicine they have already prescribed, what treatment he has already had etc.  He voices understanding and agrees.  No other concerns to address.  States that otherwise he has been feeling well and has had no other medical issues.   Home Meds:   Outpatient Medications Prior to Visit  Medication Sig Dispense Refill  . amLODipine (NORVASC) 5 MG tablet TAKE ONE TABLET BY MOUTH ONCE DAILY. 90 tablet 0  . atenolol-chlorthalidone (TENORETIC) 50-25 MG tablet TAKE 1/2 TABLET BY MOUTH EVERY MORNING. 90 tablet 0  . latanoprost (XALATAN) 0.005 % ophthalmic solution 1 drop at bedtime.    . Multiple Vitamins-Minerals (EYE VITAMINS) CAPS Take by mouth.     No facility-administered medications prior to visit.     Allergies:  Allergies  Allergen Reactions  . Penicillins Rash    REACTION: rash      Review of Systems: See HPI for pertinent  ROS. All other ROS negative.    Physical Exam: Blood pressure 118/64, pulse 63, temperature 98 F (36.7 C), temperature source Oral, resp. rate 14, height 5\' 10"  (1.778 m), weight 79.1 kg (174 lb 6.4 oz), SpO2 97 %., Body mass index is 25.02 kg/m. General:  WNWD WM. Appears in no acute distress. Neck: Supple. No thyromegaly. No lymphadenopathy. No carotid bruit. Lungs: Clear bilaterally to auscultation without wheezes, rales, or rhonchi. Breathing is unlabored. Heart: Regular rhythm. No murmurs, rubs, or gallops. Msk:  Strength and tone normal for age. Extremities/Skin: Warm and dry.  No LE edema.  Neuro: Alert and oriented X 3. Moves all extremities spontaneously. Gait is normal. CNII-XII grossly in tact. Psych:  Responds to questions appropriately with a normal affect.     ASSESSMENT AND PLAN:  84 y.o. year old male with  1. Essential hypertension Blood pressure is stable/controlled.  Continue current medications.  Follow-up with Dr. Juel Burrow office regarding further management of his shingles.  See HPI for detail.  Age 84 years old so will not perform any preventive care.  Routine office visit 6 months or sooner if needed.  Signed, [address] [place], [place], [facility] 02/02/2018 11:47 AM

## 2018-02-03 DIAGNOSIS — B029 Zoster without complications: Secondary | ICD-10-CM | POA: Diagnosis not present

## 2018-03-22 ENCOUNTER — Other Ambulatory Visit: Payer: Self-pay | Admitting: Physician Assistant

## 2018-05-10 DIAGNOSIS — H2511 Age-related nuclear cataract, right eye: Secondary | ICD-10-CM | POA: Diagnosis not present

## 2018-05-10 DIAGNOSIS — H353132 Nonexudative age-related macular degeneration, bilateral, intermediate dry stage: Secondary | ICD-10-CM | POA: Diagnosis not present

## 2018-05-10 DIAGNOSIS — H401122 Primary open-angle glaucoma, left eye, moderate stage: Secondary | ICD-10-CM | POA: Diagnosis not present

## 2018-06-28 ENCOUNTER — Other Ambulatory Visit: Payer: Self-pay | Admitting: Physician Assistant

## 2018-07-26 DIAGNOSIS — Z85820 Personal history of malignant melanoma of skin: Secondary | ICD-10-CM | POA: Diagnosis not present

## 2018-07-26 DIAGNOSIS — L570 Actinic keratosis: Secondary | ICD-10-CM | POA: Diagnosis not present

## 2018-07-26 DIAGNOSIS — Z08 Encounter for follow-up examination after completed treatment for malignant neoplasm: Secondary | ICD-10-CM | POA: Diagnosis not present

## 2018-07-26 DIAGNOSIS — Z1283 Encounter for screening for malignant neoplasm of skin: Secondary | ICD-10-CM | POA: Diagnosis not present

## 2018-08-03 ENCOUNTER — Ambulatory Visit: Payer: Medicare Other | Admitting: Physician Assistant

## 2018-08-11 ENCOUNTER — Encounter: Payer: Self-pay | Admitting: Family Medicine

## 2018-08-11 ENCOUNTER — Ambulatory Visit: Payer: Medicare Other | Admitting: Family Medicine

## 2018-08-11 VITALS — BP 126/68 | HR 60 | Temp 98.0°F | Resp 16 | Ht 70.0 in | Wt 176.0 lb

## 2018-08-11 DIAGNOSIS — Z23 Encounter for immunization: Secondary | ICD-10-CM | POA: Diagnosis not present

## 2018-08-11 DIAGNOSIS — I10 Essential (primary) hypertension: Secondary | ICD-10-CM

## 2018-08-11 NOTE — Progress Notes (Signed)
Subjective:    Patient ID: Thomas [name], male    DOB: 14-Apr-1930, 84 y.o.   MRN: [id number]  HPI Patient is here today for follow-up of his hypertension.  He is currently on a combination of Tenoretic and amlodipine.  He denies any chest pain shortness of breath or dyspnea on exertion.  Due to age, he is not due for colonoscopy or prostate cancer screening.  He is due for a flu shot.  Prevnar 13 and Pneumovax 23 are up-to-date.  He does report some mild occasional pain radiating from his left posterior hip down his left leg into his left calf concerning for possible sciatica.  However the pain is mild.  He is dealt with this off and on for more than 3 years.  It is not bad enough at the present time according to the patient to warrant imaging.  Otherwise he is doing well.  He is busy caring for his wife who has moderate dementia. Past Medical History:  Diagnosis Date  . ANEMIA 02/08/2008  . ARTHRITIS 02/08/2008  . Cataract    left eye  . 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 ELEVATED PROSTATE SPECIFIC ANTIGEN 02/08/2008  . GLAUCOMA, LEFT EYE 02/07/2009   left eye  . HISTORY OF ASBESTOS EXPOSURE 02/08/2008  . HYPERLIPIDEMIA 02/08/2008  . Hypertension   . HYPOTENSION 02/18/2010  . HYPOTHYROIDISM 02/08/2008  . Melanoma ([place])    left shoulder  . VITAMIN D DEFICIENCY 02/07/2009   Past Surgical History:  Procedure Laterality Date  . dupyreenes contractures-bilat     Current Outpatient Medications on File Prior to Visit  Medication Sig Dispense Refill  . amLODipine (NORVASC) 5 MG tablet TAKE ONE TABLET BY MOUTH ONCE DAILY. 90 tablet 0  . atenolol-chlorthalidone (TENORETIC) 50-25 MG tablet TAKE 1/2 TABLET BY MOUTH EVERY MORNING. 90 tablet 0  . latanoprost (XALATAN) 0.005 % ophthalmic solution 1 drop at bedtime.    . Multiple Vitamins-Minerals (EYE VITAMINS) CAPS Take by mouth.     No current facility-administered medications on file prior to visit.    Allergies  Allergen Reactions  . Penicillins  Rash    REACTION: rash   Social History   Socioeconomic History  . Marital status: Married    Spouse name: Not on file  . Number of children: Not on file  . Years of education: Not on file  . Highest education level: Not on file  Occupational History  . Not on file  Social Needs  . Financial resource strain: Not on file  . Food insecurity:    Worry: Not on file    Inability: Not on file  . Transportation needs:    Medical: Not on file    Non-medical: Not on file  Tobacco Use  . Smoking status: Former Smoker    Last attempt to quit: 10/08/1971    Years since quitting: 46.8  . Smokeless tobacco: Never Used  Substance and Sexual Activity  . Alcohol use: No    Alcohol/week: 0.0 standard drinks  . Drug use: No  . Sexual activity: Not Currently  Lifestyle  . Physical activity:    Days per week: Not on file    Minutes per session: Not on file  . Stress: Not on file  Relationships  . Social connections:    Talks on phone: Not on file    Gets together: Not on file    Attends religious service: Not on file    Active member of club or organization: Not on  file    Attends meetings of clubs or organizations: Not on file    Relationship status: Not on file  . Intimate partner violence:    Fear of current or ex partner: Not on file    Emotionally abused: Not on file    Physically abused: Not on file    Forced sexual activity: Not on file  Other Topics Concern  . Not on file  Social History Narrative  . Not on file      Review of Systems  All other systems reviewed and are negative.      Objective:   Physical Exam  Constitutional: He appears well-developed and well-nourished. No distress.  Cardiovascular: Normal rate, regular rhythm, normal heart sounds and intact distal pulses. Exam reveals no gallop and no friction rub.  No murmur heard. Pulmonary/Chest: Effort normal and breath sounds normal. No stridor. No respiratory distress. He has no wheezes. He has no rales.  He exhibits no tenderness.  Abdominal: Soft. Bowel sounds are normal. He exhibits no distension and no mass. There is no tenderness. There is no rebound and no guarding. No hernia.  Skin: He is not diaphoretic.  Vitals reviewed.         Assessment & Plan:  Essential hypertension - Plan: CBC with Differential/Platelet, COMPLETE METABOLIC PANEL WITH GFR, Lipid panel  Needs flu shot - Plan: Flu vaccine HIGH DOSE PF  Need for prophylactic vaccination against Streptococcus pneumoniae (pneumococcus) and influenza  Patient received his flu shot today.  I am very happy with his blood pressure.  I will check a CBC, CMP, and fasting lipid panel.  Otherwise regular respiratory guidance is provided.  I would reassess the patient in 6 months or as needed.

## 2018-08-12 LAB — COMPLETE METABOLIC PANEL WITH GFR
AG RATIO: 1.7 (calc) (ref 1.0–2.5)
ALT: 11 U/L (ref 9–46)
AST: 23 U/L (ref 10–35)
Albumin: 4.5 g/dL (ref 3.6–5.1)
Alkaline phosphatase (APISO): 49 U/L (ref 40–115)
BUN/Creatinine Ratio: 16 (calc) (ref 6–22)
BUN: 20 mg/dL (ref 7–25)
CALCIUM: 9.5 mg/dL (ref 8.6–10.3)
CO2: 32 mmol/L (ref 20–32)
CREATININE: 1.22 mg/dL — AB (ref 0.70–1.11)
Chloride: 100 mmol/L (ref 98–110)
GFR, EST AFRICAN AMERICAN: 62 mL/min/{1.73_m2} (ref >=60)
GFR, Est Non African American: 53 mL/min/{1.73_m2} — ABNORMAL LOW (ref >=60)
GLOBULIN: 2.7 g/dL (ref 1.9–3.7)
Glucose, Bld: 98 mg/dL (ref 65–99)
Potassium: 4.1 mmol/L (ref 3.5–5.3)
Sodium: 141 mmol/L (ref 135–146)
Total Bilirubin: 0.6 mg/dL (ref 0.2–1.2)
Total Protein: 7.2 g/dL (ref 6.1–8.1)

## 2018-08-12 LAB — CBC WITH DIFFERENTIAL/PLATELET
BASOS ABS: 58 {cells}/uL (ref 0–200)
Basophils Relative: 0.8 %
Eosinophils Absolute: 122 cells/uL (ref 15–500)
Eosinophils Relative: 1.7 %
HEMATOCRIT: 40.2 % (ref 38.5–50.0)
Hemoglobin: 14.2 g/dL (ref 13.2–17.1)
Lymphs Abs: 2318 cells/uL (ref 850–3900)
MCH: 32.9 pg (ref 27.0–33.0)
MCHC: 35.3 g/dL (ref 32.0–36.0)
MCV: 93.1 fL (ref 80.0–100.0)
MPV: 10.2 fL (ref 7.5–12.5)
Monocytes Relative: 9.8 %
NEUTROS ABS: 3996 {cells}/uL (ref 1500–7800)
Neutrophils Relative %: 55.5 %
Platelets: 270 10*3/uL (ref 140–400)
RBC: 4.32 10*6/uL (ref 4.20–5.80)
RDW: 11.8 % (ref 11.0–15.0)
Total Lymphocyte: 32.2 %
WBC mixed population: 706 cells/uL (ref 200–950)
WBC: 7.2 10*3/uL (ref 3.8–10.8)

## 2018-08-12 LAB — LIPID PANEL
CHOL/HDL RATIO: 2.8 (calc) (ref <5.0)
Cholesterol: 192 mg/dL (ref <200)
HDL: 69 mg/dL (ref >40)
LDL Cholesterol (Calc): 106 mg/dL (calc) — ABNORMAL HIGH
NON-HDL CHOLESTEROL (CALC): 123 mg/dL (ref <130)
TRIGLYCERIDES: 83 mg/dL (ref <150)

## 2018-09-28 ENCOUNTER — Other Ambulatory Visit: Payer: Self-pay | Admitting: Family Medicine

## 2018-10-10 ENCOUNTER — Other Ambulatory Visit: Payer: Self-pay | Admitting: Family Medicine

## 2018-11-08 DIAGNOSIS — H401122 Primary open-angle glaucoma, left eye, moderate stage: Secondary | ICD-10-CM | POA: Diagnosis not present

## 2018-11-11 ENCOUNTER — Telehealth: Payer: Self-pay | Admitting: Family Medicine

## 2018-11-11 ENCOUNTER — Other Ambulatory Visit: Payer: Self-pay | Admitting: Family Medicine

## 2018-11-11 ENCOUNTER — Encounter: Payer: Self-pay | Admitting: Family Medicine

## 2018-11-11 NOTE — Telephone Encounter (Signed)
Pt aware via vm to p/u for on monday

## 2018-11-11 NOTE — Telephone Encounter (Signed)
Pt needs a letter stating that he is mentally capable of driving a vehicle for his insurance. He has changed insurance companies and apparently after a certain age you have to have a letter stating you are mentally ok to drive. Ok to write?

## 2018-11-11 NOTE — Telephone Encounter (Signed)
Patient has a letter regarding driving a car in his chart under jury duty if we can print so I can sign.

## 2019-03-31 ENCOUNTER — Other Ambulatory Visit: Payer: Self-pay | Admitting: Family Medicine

## 2019-05-01 ENCOUNTER — Ambulatory Visit (INDEPENDENT_AMBULATORY_CARE_PROVIDER_SITE_OTHER): Payer: Medicare Other | Admitting: Family Medicine

## 2019-05-01 ENCOUNTER — Other Ambulatory Visit: Payer: Self-pay

## 2019-05-01 DIAGNOSIS — J329 Chronic sinusitis, unspecified: Secondary | ICD-10-CM

## 2019-05-01 MED ORDER — FLUTICASONE PROPIONATE 50 MCG/ACT NA SUSP: 2.0000 | Freq: Every day | NASAL | 6 refills | Status: DC

## 2019-05-01 MED ORDER — LEVOCETIRIZINE DIHYDROCHLORIDE 5 MG PO TABS: 5.0000 mg | ORAL_TABLET | Freq: Every evening | ORAL | 0 refills | Status: DC

## 2019-05-01 NOTE — Progress Notes (Signed)
Subjective:    Patient ID: Thomas [name], male    DOB: 1930/07/28, 85 y.o.   MRN: [id number]  HPI Patient is being seen today as a telephone visit.  Phone call began at 915.  Phone call concluded at 924.  Patient consents to be seen over the telephone.  He states for the last week he has had sinus drainage.  He reports postnasal drip draining down his throat causing him to cough.  He denies any shortness of breath.  He denies any chest pain.  He denies any fevers or chills.  He denies any sinus pain or sinus headache.  He denies any hemoptysis or purulent sputum.  He denies any pain in his cheeks or pain behind his eyes.  He denies any otalgia or sore throat.  He is tried Mucinex with some minimal relief but the symptoms persist. Past Medical History:  Diagnosis Date  . ANEMIA 02/08/2008  . ARTHRITIS 02/08/2008  . Cataract    left eye  . 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 ELEVATED PROSTATE SPECIFIC ANTIGEN 02/08/2008  . GLAUCOMA, LEFT EYE 02/07/2009   left eye  . HISTORY OF ASBESTOS EXPOSURE 02/08/2008  . HYPERLIPIDEMIA 02/08/2008  . Hypertension   . HYPOTENSION 02/18/2010  . HYPOTHYROIDISM 02/08/2008  . Melanoma ([place])    left shoulder  . VITAMIN D DEFICIENCY 02/07/2009   Past Surgical History:  Procedure Laterality Date  . dupyreenes contractures-bilat     Current Outpatient Medications on File Prior to Visit  Medication Sig Dispense Refill  . amLODipine (NORVASC) 5 MG tablet TAKE ONE TABLET BY MOUTH ONCE DAILY. 90 tablet 3  . atenolol-chlorthalidone (TENORETIC) 50-25 MG tablet TAKE 1/2 TABLET BY MOUTH EVERY MORNING. 90 tablet 0  . latanoprost (XALATAN) 0.005 % ophthalmic solution 1 drop at bedtime.    . Multiple Vitamins-Minerals (EYE VITAMINS) CAPS Take by mouth.     No current facility-administered medications on file prior to visit.    Allergies  Allergen Reactions  . Penicillins Rash    REACTION: rash   Social History   Socioeconomic History  . Marital status: Married   Spouse name: Not on file  . Number of children: Not on file  . Years of education: Not on file  . Highest education level: Not on file  Occupational History  . Not on file  Social Needs  . Financial resource strain: Not on file  . Food insecurity    Worry: Not on file    Inability: Not on file  . Transportation needs    Medical: Not on file    Non-medical: Not on file  Tobacco Use  . Smoking status: Former Smoker    Quit date: 10/08/1971    Years since quitting: 47.5  . Smokeless tobacco: Never Used  Substance and Sexual Activity  . Alcohol use: No    Alcohol/week: 0.0 standard drinks  . Drug use: No  . Sexual activity: Not Currently  Lifestyle  . Physical activity    Days per week: Not on file    Minutes per session: Not on file  . Stress: Not on file  Relationships  . Social Herbalist on phone: Not on file    Gets together: Not on file    Attends religious service: Not on file    Active member of club or organization: Not on file    Attends meetings of clubs or organizations: Not on file    Relationship status: Not on file  .  Intimate partner violence    Fear of current or ex partner: Not on file    Emotionally abused: Not on file    Physically abused: Not on file    Forced sexual activity: Not on file  Other Topics Concern  . Not on file  Social History Narrative  . Not on file      Review of Systems  All other systems reviewed and are negative.      Objective:   Physical Exam  Physical exam cannot be performed today as the patient was seen over the telephone.  However he is speaking full and complete sentences without any respiratory distress      Assessment & Plan:  The encounter diagnosis was Other sinusitis, unspecified chronicity. Symptoms sound like postnasal drip due to sinusitis.  I will try him on Flonase 2 sprays each nostril daily coupled with Xyzal 5 mg daily.  Reassess later this week if no better or sooner/immediately if  worsening or if he develops a fever.

## 2019-05-04 ENCOUNTER — Telehealth: Payer: Self-pay | Admitting: *Deleted

## 2019-05-04 NOTE — Telephone Encounter (Signed)
Received call from patient daughter.   Reports that patient post- nasal drip and sinus pressure has resolved. States that patient still has no appetite and is felling very weak. States that he is not eating, but he is drink a little boost/ ensure.   Advised that Sx can persist for up to a week after resolution of illness, but with advanced age, advised to schedule telephone visit with PCP to follow up.   Appointment scheduled for 05/05/2019.

## 2019-05-05 ENCOUNTER — Other Ambulatory Visit: Payer: Self-pay

## 2019-05-05 ENCOUNTER — Ambulatory Visit (INDEPENDENT_AMBULATORY_CARE_PROVIDER_SITE_OTHER): Payer: Medicare Other | Admitting: Family Medicine

## 2019-05-05 DIAGNOSIS — R634 Abnormal weight loss: Secondary | ICD-10-CM | POA: Diagnosis not present

## 2019-05-05 NOTE — Progress Notes (Signed)
Subjective:    Patient ID: Thomas [name], male    DOB: 1930-02-12, 85 y.o.   MRN: [id number]  HPI  05/01/19 Patient is being seen today as a telephone visit.  Phone call began at 915.  Phone call concluded at 924.  Patient consents to be seen over the telephone.  He states for the last week he has had sinus drainage.  He reports postnasal drip draining down his throat causing him to cough.  He denies any shortness of breath.  He denies any chest pain.  He denies any fevers or chills.  He denies any sinus pain or sinus headache.  He denies any hemoptysis or purulent sputum.  He denies any pain in his cheeks or pain behind his eyes.  He denies any otalgia or sore throat.  He is tried Mucinex with some minimal relief but the symptoms persist.  At that time, my plan was: Symptoms sound like postnasal drip due to sinusitis.  I will try him on Flonase 2 sprays each nostril daily coupled with Xyzal 5 mg daily.  Reassess later this week if no better or sooner/immediately if worsening or if he develops a fever.  05/05/19 Patient is being seen today as a telephone visit.  He consents to be seen over the telephone.  Phone call began at 824.  Phone call concluded at 833.  Patient states that his postnasal drip has completely stopped.  His sinus drainage has stopped.  He denies any sinus pain.  He denies any headache.  However he states that he has had very little appetite over the last 10 days.  He anticipates that he is lost about 3 pounds.  He denies any abdominal pain.  He denies any nausea or vomiting.  He denies any diarrhea or constipation.  He denies any melena or hematochezia.  He denies any cough, fever, shortness of breath, or chest pain.  He has been drinking Ensure 2 cans a day.  However with poor appetite and the weight loss, he reports feeling weak and unsteady on his feet.  He denies any neurologic deficits or strokelike symptoms.  He denies any delirium or confusion.  He also denies any dysuria  Past Medical History:  Diagnosis Date  . ANEMIA 02/08/2008  . ARTHRITIS 02/08/2008  . Cataract    left eye  . 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 ELEVATED PROSTATE SPECIFIC ANTIGEN 02/08/2008  . GLAUCOMA, LEFT EYE 02/07/2009   left eye  . HISTORY OF ASBESTOS EXPOSURE 02/08/2008  . HYPERLIPIDEMIA 02/08/2008  . Hypertension   . HYPOTENSION 02/18/2010  . HYPOTHYROIDISM 02/08/2008  . Melanoma ([place])    left shoulder  . VITAMIN D DEFICIENCY 02/07/2009   Past Surgical History:  Procedure Laterality Date  . dupyreenes contractures-bilat     Current Outpatient Medications on File Prior to Visit  Medication Sig Dispense Refill  . amLODipine (NORVASC) 5 MG tablet TAKE ONE TABLET BY MOUTH ONCE DAILY. 90 tablet 3  . atenolol-chlorthalidone (TENORETIC) 50-25 MG tablet TAKE 1/2 TABLET BY MOUTH EVERY MORNING. 90 tablet 0  . fluticasone (FLONASE) 50 MCG/ACT nasal spray Place 2 sprays into both nostrils daily. 16 g 6  . latanoprost (XALATAN) 0.005 % ophthalmic solution 1 drop at bedtime.    Marland Kitchen levocetirizine (XYZAL) 5 MG tablet Take 1 tablet (5 mg total) by mouth every evening. 30 tablet 0  . Multiple Vitamins-Minerals (EYE VITAMINS) CAPS Take by mouth.     No current facility-administered medications on file prior to visit.  Allergies  Allergen Reactions  . Penicillins Rash    REACTION: rash   Social History   Socioeconomic History  . Marital status: Married    Spouse name: Not on file  . Number of children: Not on file  . Years of education: Not on file  . Highest education level: Not on file  Occupational History  . Not on file  Social Needs  . Financial resource strain: Not on file  . Food insecurity    Worry: Not on file    Inability: Not on file  . Transportation needs    Medical: Not on file    Non-medical: Not on file  Tobacco Use  . Smoking status: Former Smoker    Quit date: 10/08/1971    Years since quitting: 47.6  . Smokeless tobacco: Never Used  Substance and Sexual Activity   . Alcohol use: No    Alcohol/week: 0.0 standard drinks  . Drug use: No  . Sexual activity: Not Currently  Lifestyle  . Physical activity    Days per week: Not on file    Minutes per session: Not on file  . Stress: Not on file  Relationships  . Social Herbalist on phone: Not on file    Gets together: Not on file    Attends religious service: Not on file    Active member of club or organization: Not on file    Attends meetings of clubs or organizations: Not on file    Relationship status: Not on file  . Intimate partner violence    Fear of current or ex partner: Not on file    Emotionally abused: Not on file    Physically abused: Not on file    Forced sexual activity: Not on file  Other Topics Concern  . Not on file  Social History Narrative  . Not on file      Review of Systems  All other systems reviewed and are negative.      Objective:   Physical Exam  Physical exam cannot be performed today as the patient was seen over the telephone.  However he is speaking full and complete sentences without any respiratory distress      Assessment & Plan:  The encounter diagnosis was Weight loss. Difficult to ascertain the cause without examining the patient however I suspect that the patient is weak and deconditioned after a recent upper respiratory infection.  I have recommended that he continue the Ensure 2 cans a day.  We discussed adding Remeron as an appetite stimulant but he declines doing this at the present time.  He feels like he is slowly getting better so he would like to give it some additional time to see if his strength improves.  I have asked the patient to come in for lab work to ensure that there is not a bigger issue.  I would like to check a CBC, CMP, TSH, sed rate, and urinalysis.  If the labs are normal, I would give the patient additional time to try to recover however I want to rule out underlying medical causes of fatigue, weight loss, and  weakness.  Patient is comfortable with this plan.  He will seek medical attention over the weekend if he gets worse.  He declines coming in for lab work today and prefers to come in on Monday.

## 2019-05-08 ENCOUNTER — Other Ambulatory Visit: Payer: Medicare Other

## 2019-05-08 ENCOUNTER — Other Ambulatory Visit: Payer: Self-pay

## 2019-05-08 LAB — URINALYSIS, ROUTINE W REFLEX MICROSCOPIC
Bilirubin Urine: NEGATIVE
Glucose, UA: NEGATIVE
Leukocytes,Ua: NEGATIVE
Nitrite: NEGATIVE
Specific Gravity, Urine: 1.025 (ref 1.001–1.03)
pH: 5 (ref 5.0–8.0)

## 2019-05-08 LAB — MICROSCOPIC MESSAGE

## 2019-05-09 ENCOUNTER — Encounter: Payer: Self-pay | Admitting: Family Medicine

## 2019-05-09 ENCOUNTER — Other Ambulatory Visit: Payer: Self-pay | Admitting: Emergency Medicine

## 2019-05-09 ENCOUNTER — Ambulatory Visit (INDEPENDENT_AMBULATORY_CARE_PROVIDER_SITE_OTHER): Payer: Medicare Other | Admitting: Family Medicine

## 2019-05-09 VITALS — BP 110/60 | HR 78 | Temp 97.9°F | Resp 16 | Ht 70.0 in | Wt 176.0 lb

## 2019-05-09 DIAGNOSIS — Z20822 Contact with and (suspected) exposure to covid-19: Secondary | ICD-10-CM

## 2019-05-09 DIAGNOSIS — R634 Abnormal weight loss: Secondary | ICD-10-CM

## 2019-05-09 DIAGNOSIS — D72828 Other elevated white blood cell count: Secondary | ICD-10-CM

## 2019-05-09 LAB — TSH: TSH: 3.16 mIU/L (ref 0.40–4.50)

## 2019-05-09 LAB — COMPLETE METABOLIC PANEL WITH GFR
AG Ratio: 0.9 (calc) — ABNORMAL LOW (ref 1.0–2.5)
ALT: 14 U/L (ref 9–46)
AST: 25 U/L (ref 10–35)
Albumin: 2.7 g/dL — ABNORMAL LOW (ref 3.6–5.1)
Alkaline phosphatase (APISO): 89 U/L (ref 35–144)
BUN/Creatinine Ratio: 24 (calc) — ABNORMAL HIGH (ref 6–22)
BUN: 30 mg/dL — ABNORMAL HIGH (ref 7–25)
CO2: 30 mmol/L (ref 20–32)
Calcium: 8.7 mg/dL (ref 8.6–10.3)
Chloride: 88 mmol/L — ABNORMAL LOW (ref 98–110)
Creat: 1.27 mg/dL — ABNORMAL HIGH (ref 0.70–1.11)
GFR, Est African American: 58 mL/min/{1.73_m2} — ABNORMAL LOW (ref >=60)
GFR, Est Non African American: 50 mL/min/{1.73_m2} — ABNORMAL LOW (ref >=60)
Globulin: 3.1 g/dL (calc) (ref 1.9–3.7)
Glucose, Bld: 116 mg/dL — ABNORMAL HIGH (ref 65–99)
Potassium: 4.9 mmol/L (ref 3.5–5.3)
Sodium: 130 mmol/L — ABNORMAL LOW (ref 135–146)
Total Bilirubin: 0.5 mg/dL (ref 0.2–1.2)
Total Protein: 5.8 g/dL — ABNORMAL LOW (ref 6.1–8.1)

## 2019-05-09 LAB — CBC WITH DIFFERENTIAL/PLATELET
Absolute Monocytes: 1024 cells/uL — ABNORMAL HIGH (ref 200–950)
Basophils Absolute: 32 cells/uL (ref 0–200)
Basophils Relative: 0.2 %
Eosinophils Absolute: 16 cells/uL (ref 15–500)
Eosinophils Relative: 0.1 %
HCT: 34 % — ABNORMAL LOW (ref 38.5–50.0)
Hemoglobin: 11.6 g/dL — ABNORMAL LOW (ref 13.2–17.1)
Lymphs Abs: 736 cells/uL — ABNORMAL LOW (ref 850–3900)
MCH: 29.5 pg (ref 27.0–33.0)
MCHC: 34.1 g/dL (ref 32.0–36.0)
MCV: 86.5 fL (ref 80.0–100.0)
MPV: 9 fL (ref 7.5–12.5)
Monocytes Relative: 6.4 %
Neutro Abs: 14192 cells/uL — ABNORMAL HIGH (ref 1500–7800)
Neutrophils Relative %: 88.7 %
Platelets: 528 10*3/uL — ABNORMAL HIGH (ref 140–400)
RBC: 3.93 10*6/uL — ABNORMAL LOW (ref 4.20–5.80)
RDW: 12.1 % (ref 11.0–15.0)
Total Lymphocyte: 4.6 %
WBC: 16 10*3/uL — ABNORMAL HIGH (ref 3.8–10.8)

## 2019-05-09 LAB — SEDIMENTATION RATE: Sed Rate: 67 mm/h — ABNORMAL HIGH (ref 0–20)

## 2019-05-09 MED ORDER — LEVOFLOXACIN 500 MG PO TABS: 500.0000 mg | ORAL_TABLET | Freq: Every day | ORAL | 0 refills | Status: DC

## 2019-05-09 NOTE — Progress Notes (Signed)
Subjective:    Patient ID: Thomas [name], male    DOB: 07-23-1930, 85 y.o.   MRN: [id number]  HPI Please see my previous office visit, the patient initially was dealing with sinusitis.  The sinusitis got better.  This started about 3 weeks ago.  However the last 3 weeks, he has progressively lost his appetite.  He is becoming progressively weaker.  He denies any fever.  However he has developed a cough.  He reports mucus that he has a difficult time bringing up in his chest.  He also denies any purulent sputum, he denies hemoptysis, he denies shortness of breath.  He does report nausea and occasional vomiting.  He is becoming progressively weaker.  He denies any abdominal pain.  He denies any constipation.  He denies any melena or hematochezia.  Came in for lab work yesterday which showed leukocytosis with an elevated neutrophil count.  His sed rate was also elevated at 67 and his labs suggested dehydration with hyponatremia. Office Visit on 05/05/2019  Component Date Value Ref Range Status  . WBC 05/08/2019 16.0* 3.8 - 10.8 Thousand/uL Final  . RBC 05/08/2019 3.93* 4.20 - 5.80 Million/uL Final  . Hemoglobin 05/08/2019 11.6* 13.2 - 17.1 g/dL Final  . HCT 05/08/2019 34.0* 38.5 - 50.0 % Final  . MCV 05/08/2019 86.5  80.0 - 100.0 fL Final  . MCH 05/08/2019 29.5  27.0 - 33.0 pg Final  . MCHC 05/08/2019 34.1  32.0 - 36.0 g/dL Final  . RDW 05/08/2019 12.1  11.0 - 15.0 % Final  . Platelets 05/08/2019 528* 140 - 400 Thousand/uL Final  . MPV 05/08/2019 9.0  7.5 - 12.5 fL Final  . Neutro Abs 05/08/2019 14,192* 1,500 - 7,800 cells/uL Final  . Lymphs Abs 05/08/2019 736* 850 - 3,900 cells/uL Final  . Absolute Monocytes 05/08/2019 1,024* 200 - 950 cells/uL Final  . Eosinophils Absolute 05/08/2019 16  15 - 500 cells/uL Final  . Basophils Absolute 05/08/2019 32  0 - 200 cells/uL Final  . Neutrophils Relative % 05/08/2019 88.7  % Final  . Total Lymphocyte 05/08/2019 4.6  % Final  . Monocytes Relative  05/08/2019 6.4  % Final  . Eosinophils Relative 05/08/2019 0.1  % Final  . Basophils Relative 05/08/2019 0.2  % Final  . Glucose, Bld 05/08/2019 116* 65 - 99 mg/dL Final   Comment: .            Fasting reference interval . For someone without known diabetes, a glucose value between 100 and 125 mg/dL is consistent with prediabetes and should be confirmed with a follow-up test. .   . BUN 05/08/2019 30* 7 - 25 mg/dL Final  . Creat 05/08/2019 1.27* 0.70 - 1.11 mg/dL Final   Comment: For patients >63 years of age, the reference limit for Creatinine is approximately 13% higher for people identified as African-American. .   . GFR, Est Non African American 05/08/2019 50* > OR = 60 mL/min/1.71m2 Final  . GFR, Est African American 05/08/2019 58* > OR = 60 mL/min/1.37m2 Final  . BUN/Creatinine Ratio 05/08/2019 24* 6 - 22 (calc) Final  . Sodium 05/08/2019 130* 135 - 146 mmol/L Final  . Potassium 05/08/2019 4.9  3.5 - 5.3 mmol/L Final  . Chloride 05/08/2019 88* 98 - 110 mmol/L Final  . CO2 05/08/2019 30  20 - 32 mmol/L Final  . Calcium 05/08/2019 8.7  8.6 - 10.3 mg/dL Final  . Total Protein 05/08/2019 5.8* 6.1 - 8.1 g/dL Final  .  Albumin 05/08/2019 2.7* 3.6 - 5.1 g/dL Final  . Globulin 05/08/2019 3.1  1.9 - 3.7 g/dL (calc) Final  . AG Ratio 05/08/2019 0.9* 1.0 - 2.5 (calc) Final  . Total Bilirubin 05/08/2019 0.5  0.2 - 1.2 mg/dL Final  . Alkaline phosphatase (APISO) 05/08/2019 89  35 - 144 U/L Final  . AST 05/08/2019 25  10 - 35 U/L Final  . ALT 05/08/2019 14  9 - 46 U/L Final  . TSH 05/08/2019 3.16  0.40 - 4.50 mIU/L Final  . Sed Rate 05/08/2019 67* 0 - 20 mm/h Final  . Color, Urine 05/08/2019 YELLOW  YELLOW Final  . APPearance 05/08/2019 CLEAR  CLEAR Final  . Specific Gravity, Urine 05/08/2019 1.025  1.001 - 1.03 Final  . pH 05/08/2019 5.0  5.0 - 8.0 Final  . Glucose, UA 05/08/2019 NEGATIVE  NEGATIVE Final  . Bilirubin Urine 05/08/2019 NEGATIVE  NEGATIVE Final   Comment: Verified by  repeat analysis. .   Tenna Child, ur 05/08/2019 TRACE* NEGATIVE Final  . Hgb urine dipstick 05/08/2019 TRACE* NEGATIVE Final  . Protein, ur 05/08/2019 1+* NEGATIVE Final  . Nitrite 05/08/2019 NEGATIVE  NEGATIVE Final  . Chalmers Guest 05/08/2019 NEGATIVE  NEGATIVE Final  . WBC, UA 05/08/2019 CANCELED   Final   Comment: TEST NOT PERFORMED . Quantity not sufficient.  Result canceled by the ancillary.   . Note 05/08/2019    Final   Comment: This urine was analyzed for the presence of WBC,  RBC, bacteria, casts, and other formed elements.  Only those elements seen were reported. . .    Patient is confined to his home caring for his wife who has dementia.  His daughter is with him.  They are asking if I can give his wife something to help her delirium so that the family can come in the home and help care for their father.  We discussed this and I recommended trying Haldol 0.5 mg twice daily as needed delirium. Past Medical History:  Diagnosis Date  . ANEMIA 02/08/2008  . ARTHRITIS 02/08/2008  . Cataract    left eye  . 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 ELEVATED PROSTATE SPECIFIC ANTIGEN 02/08/2008  . GLAUCOMA, LEFT EYE 02/07/2009   left eye  . HISTORY OF ASBESTOS EXPOSURE 02/08/2008  . HYPERLIPIDEMIA 02/08/2008  . Hypertension   . HYPOTENSION 02/18/2010  . HYPOTHYROIDISM 02/08/2008  . Melanoma ([place])    left shoulder  . VITAMIN D DEFICIENCY 02/07/2009   Past Surgical History:  Procedure Laterality Date  . dupyreenes contractures-bilat     Current Outpatient Medications on File Prior to Visit  Medication Sig Dispense Refill  . amLODipine (NORVASC) 5 MG tablet TAKE ONE TABLET BY MOUTH ONCE DAILY. 90 tablet 3  . atenolol-chlorthalidone (TENORETIC) 50-25 MG tablet TAKE 1/2 TABLET BY MOUTH EVERY MORNING. 90 tablet 0  . fluticasone (FLONASE) 50 MCG/ACT nasal spray Place 2 sprays into both nostrils daily. 16 g 6  . latanoprost (XALATAN) 0.005 % ophthalmic solution 1 drop at bedtime.    Marland Kitchen  levocetirizine (XYZAL) 5 MG tablet Take 1 tablet (5 mg total) by mouth every evening. 30 tablet 0  . Multiple Vitamins-Minerals (EYE VITAMINS) CAPS Take by mouth.     No current facility-administered medications on file prior to visit.    Allergies  Allergen Reactions  . Penicillins Rash    REACTION: rash   Social History   Socioeconomic History  . Marital status: Married    Spouse name: Not on file  .  Number of children: Not on file  . Years of education: Not on file  . Highest education level: Not on file  Occupational History  . Not on file  Social Needs  . Financial resource strain: Not on file  . Food insecurity    Worry: Not on file    Inability: Not on file  . Transportation needs    Medical: Not on file    Non-medical: Not on file  Tobacco Use  . Smoking status: Former Smoker    Quit date: 10/08/1971    Years since quitting: 47.6  . Smokeless tobacco: Never Used  Substance and Sexual Activity  . Alcohol use: No    Alcohol/week: 0.0 standard drinks  . Drug use: No  . Sexual activity: Not Currently  Lifestyle  . Physical activity    Days per week: Not on file    Minutes per session: Not on file  . Stress: Not on file  Relationships  . Social Herbalist on phone: Not on file    Gets together: Not on file    Attends religious service: Not on file    Active member of club or organization: Not on file    Attends meetings of clubs or organizations: Not on file    Relationship status: Not on file  . Intimate partner violence    Fear of current or ex partner: Not on file    Emotionally abused: Not on file    Physically abused: Not on file    Forced sexual activity: Not on file  Other Topics Concern  . Not on file  Social History Narrative  . Not on file      Review of Systems  All other systems reviewed and are negative.      Objective:   Physical Exam Constitutional:      General: He is not in acute distress.    Appearance: He is  underweight. He is not ill-appearing, toxic-appearing or diaphoretic.  HENT:     Right Ear: Tympanic membrane and ear canal normal.     Left Ear: Tympanic membrane and ear canal normal.  Eyes:     General: No scleral icterus.    Conjunctiva/sclera: Conjunctivae normal.  Cardiovascular:     Rate and Rhythm: Regular rhythm.     Heart sounds: Normal heart sounds.  Pulmonary:     Effort: Pulmonary effort is normal. No respiratory distress.     Breath sounds: Examination of the right-lower field reveals rhonchi and rales. Rhonchi and rales present.  Abdominal:     General: Abdomen is flat. Bowel sounds are normal. There is no distension.     Palpations: Abdomen is soft.     Tenderness: There is no abdominal tenderness.  Lymphadenopathy:     Cervical: No cervical adenopathy.  Neurological:     Mental Status: He is alert.  Psychiatric:        Behavior: Behavior is cooperative.           Assessment & Plan:  Weight loss - Plan: DG Chest 2 View, DG Abd 2 Views, Novel Coronavirus, NAA ([facility])  Other elevated white blood cell (WBC) count - Plan: DG Chest 2 View, DG Abd 2 Views, Novel Coronavirus, NAA ([facility])  I suspect pneumonia coupled with dehydration and failure to thrive.  Patient is not hypoxic but due to the dehydration and his advanced age, he may require hospitalization.  After discussion with the family, we have decided to try outpatient treatment  with Levaquin 500 mg p.o. daily for 7 days.  I recommended pushing fluids.  I will obtain a chest x-ray immediately.  Although I have a very low index of suspicion I will also screen the patient for coronavirus.  Given the nausea and vomiting I will also obtain an abdominal x-ray to rule out ileus or small bowel obstruction.  If symptoms worsen he is to go immediately to the emergency room.  Reassess in 48 hours.

## 2019-05-10 ENCOUNTER — Emergency Department (HOSPITAL_COMMUNITY)
Admission: EM | Admit: 2019-05-10 | Discharge: 2019-05-11 | Disposition: A | Discharge status: Home / Self Care | Payer: Medicare Other | Attending: Emergency Medicine | Admitting: Emergency Medicine

## 2019-05-10 ENCOUNTER — Other Ambulatory Visit: Payer: Self-pay

## 2019-05-10 ENCOUNTER — Encounter (HOSPITAL_COMMUNITY): Payer: Self-pay

## 2019-05-10 ENCOUNTER — Emergency Department (HOSPITAL_COMMUNITY): Payer: Medicare Other

## 2019-05-10 DIAGNOSIS — C801 Malignant (primary) neoplasm, unspecified: Secondary | ICD-10-CM | POA: Diagnosis not present

## 2019-05-10 DIAGNOSIS — E039 Hypothyroidism, unspecified: Secondary | ICD-10-CM | POA: Insufficient documentation

## 2019-05-10 DIAGNOSIS — R112 Nausea with vomiting, unspecified: Secondary | ICD-10-CM | POA: Insufficient documentation

## 2019-05-10 DIAGNOSIS — Z85828 Personal history of other malignant neoplasm of skin: Secondary | ICD-10-CM | POA: Insufficient documentation

## 2019-05-10 DIAGNOSIS — Z87891 Personal history of nicotine dependence: Secondary | ICD-10-CM | POA: Diagnosis not present

## 2019-05-10 DIAGNOSIS — I10 Essential (primary) hypertension: Secondary | ICD-10-CM | POA: Insufficient documentation

## 2019-05-10 DIAGNOSIS — R180 Malignant ascites: Secondary | ICD-10-CM | POA: Diagnosis not present

## 2019-05-10 LAB — BASIC METABOLIC PANEL
Anion gap: 15 (ref 5–15)
BUN: 28 mg/dL — ABNORMAL HIGH (ref 8–23)
CO2: 26 mmol/L (ref 22–32)
Calcium: 8.6 mg/dL — ABNORMAL LOW (ref 8.9–10.3)
Chloride: 88 mmol/L — ABNORMAL LOW (ref 98–111)
Creatinine, Ser: 1.38 mg/dL — ABNORMAL HIGH (ref 0.61–1.24)
GFR calc Af Amer: 53 mL/min — ABNORMAL LOW (ref >60)
GFR calc non Af Amer: 46 mL/min — ABNORMAL LOW (ref >60)
Glucose, Bld: 117 mg/dL — ABNORMAL HIGH (ref 70–99)
Potassium: 4.4 mmol/L (ref 3.5–5.1)
Sodium: 129 mmol/L — ABNORMAL LOW (ref 135–145)

## 2019-05-10 LAB — CBC
HCT: 34.3 % — ABNORMAL LOW (ref 39.0–52.0)
Hemoglobin: 11.5 g/dL — ABNORMAL LOW (ref 13.0–17.0)
MCH: 29.7 pg (ref 26.0–34.0)
MCHC: 33.5 g/dL (ref 30.0–36.0)
MCV: 88.6 fL (ref 80.0–100.0)
Platelets: 537 10*3/uL — ABNORMAL HIGH (ref 150–400)
RBC: 3.87 MIL/uL — ABNORMAL LOW (ref 4.22–5.81)
RDW: 13.2 % (ref 11.5–15.5)
WBC: 15.2 10*3/uL — ABNORMAL HIGH (ref 4.0–10.5)
nRBC: 0 % (ref 0.0–0.2)

## 2019-05-10 MED ORDER — SODIUM CHLORIDE 0.9% FLUSH: 3.0000 mL | Freq: Once | INTRAVENOUS | Status: DC

## 2019-05-10 NOTE — ED Triage Notes (Signed)
Pt was sent for COVID test yesterday, pending results.  Was to get chest xray at imaging center but since no COVID results would not do imaging.  PCP thought might have some pneumonia.  Pt reports mild cough, with lots of phlegm, weakness, and decreased PO fluid/food intake.  No respiratory or swallowing difficulties.

## 2019-05-11 ENCOUNTER — Other Ambulatory Visit: Payer: Self-pay | Admitting: Oncology

## 2019-05-11 ENCOUNTER — Telehealth: Payer: Self-pay | Admitting: Family Medicine

## 2019-05-11 ENCOUNTER — Telehealth: Payer: Self-pay | Admitting: Oncology

## 2019-05-11 ENCOUNTER — Emergency Department (HOSPITAL_COMMUNITY): Payer: Medicare Other

## 2019-05-11 ENCOUNTER — Other Ambulatory Visit: Payer: Self-pay | Admitting: Family Medicine

## 2019-05-11 DIAGNOSIS — R188 Other ascites: Secondary | ICD-10-CM

## 2019-05-11 DIAGNOSIS — C459 Mesothelioma, unspecified: Secondary | ICD-10-CM

## 2019-05-11 DIAGNOSIS — C451 Mesothelioma of peritoneum: Secondary | ICD-10-CM

## 2019-05-11 HISTORY — DX: Mesothelioma, unspecified: C45.9

## 2019-05-11 LAB — NOVEL CORONAVIRUS, NAA: SARS-CoV-2, NAA: NOT DETECTED

## 2019-05-11 LAB — CBG MONITORING, ED: Glucose-Capillary: 105 mg/dL — ABNORMAL HIGH (ref 70–99)

## 2019-05-11 LAB — LIPASE, BLOOD: Lipase: 30 U/L (ref 11–51)

## 2019-05-11 MED ORDER — LACTATED RINGERS IV BOLUS
1000.0000 mL | Freq: Once | INTRAVENOUS | Status: AC
  Administered 2019-05-11: 1000 mL via INTRAVENOUS

## 2019-05-11 MED ORDER — ONDANSETRON 4 MG PO TBDP: 4.0000 mg | ORAL_TABLET | Freq: Three times a day (TID) | ORAL | 0 refills | Status: DC | PRN

## 2019-05-11 MED ORDER — ONDANSETRON HCL 4 MG/2ML IJ SOLN
4.0000 mg | Freq: Once | INTRAMUSCULAR | Status: AC
  Administered 2019-05-11: 4 mg via INTRAVENOUS
  Filled 2019-05-11: qty 2

## 2019-05-11 MED ORDER — PANTOPRAZOLE SODIUM 20 MG PO TBEC: 20.0000 mg | DELAYED_RELEASE_TABLET | Freq: Every day | ORAL | 0 refills | Status: DC

## 2019-05-11 MED ORDER — IOHEXOL 300 MG/ML  SOLN
100.0000 mL | Freq: Once | INTRAMUSCULAR | Status: AC | PRN
  Administered 2019-05-11: 05:00:00 100 mL via INTRAVENOUS

## 2019-05-11 MED ORDER — ALUM & MAG HYDROXIDE-SIMETH 200-200-20 MG/5ML PO SUSP
30.0000 mL | Freq: Once | ORAL | Status: AC
  Administered 2019-05-11: 05:00:00 30 mL via ORAL
  Filled 2019-05-11: qty 30

## 2019-05-11 NOTE — Telephone Encounter (Signed)
Patient's daughter called stating that patient is still vomiting even after taking Zofran. States that he took a few sips of liquid and he vomited it up. He is unable to eat or drink. She states she does not understand why he was discharged and wants to know what you recommend?

## 2019-05-11 NOTE — Telephone Encounter (Signed)
He needs to go back to the hospital.  If he cannot keep anything down they can admit him for dehydration and IVF.

## 2019-05-11 NOTE — Telephone Encounter (Signed)
Spoke with patient's daughter and informed her that Dr. Dennard Schaumann recommended that he go back to the ER. Patient's daughter states that she will attempt to get him to go but she does not believe he will go due to his wife having a fall today and her having dementia and only letting him care for her. Advised it is still recommended.

## 2019-05-11 NOTE — ED Provider Notes (Signed)
Emergency Department Provider Note   I have reviewed the triage vital signs and the nursing notes.   HISTORY  Chief Complaint No chief complaint on file.   HPI Thomas [name] is a 85 y.o. male who presents the emergency department today at the request of his PCP to get a x-ray for evaluation of his lungs.  Patient daughter with them and they state that he has been having dry heaving with some mucus development for the last couple weeks.  Has had increased nausea and vomiting at times while time eats or drinks anything he vomits it up.  No history of the same.  Patient states that there with her doctor who thought he might have pneumonia and also check a COVID.  It was negative.  Patient was started on Levaquin  and request to get a chest x-ray however they would not do it because of the coronavirus was sent here for further evaluation.  On review of systems also notes some weight loss.  He also states his abdomen is a bit more distended than normal.  He has intermittent bowel movements last one was 3 or 4 days ago and does remember the one before that.  Still passing gas.  No significant abdominal pain.  He has significantly decreased urine output secondary to not taking anything in. No cough, fever or other respiratory symptoms.   No other associated or modifying symptoms.    Past Medical History:  Diagnosis Date   ANEMIA 02/08/2008   ARTHRITIS 02/08/2008   Cataract    left eye   CONTRACTURE OF TENDON 02/08/2008   ELEVATED PROSTATE SPECIFIC ANTIGEN 02/08/2008   GLAUCOMA, LEFT EYE 02/07/2009   left eye   HISTORY OF ASBESTOS EXPOSURE 02/08/2008   HYPERLIPIDEMIA 02/08/2008   Hypertension    HYPOTENSION 02/18/2010   HYPOTHYROIDISM 02/08/2008   Melanoma ([place])    left shoulder   VITAMIN D DEFICIENCY 02/07/2009    Patient Active Problem List   Diagnosis Date Noted   Glaucoma 02/07/2009   ARTHRITIS 02/08/2008   ELEVATED PROSTATE SPECIFIC ANTIGEN 02/08/2008   HISTORY OF  ASBESTOS EXPOSURE 02/08/2008   Essential hypertension 01/27/2007    Past Surgical History:  Procedure Laterality Date   dupyreenes contractures-bilat      Current Outpatient Rx   Order #: [id number] Class: Normal   Order #: [id number] Class: Normal   Order #: [id number] Class: Normal   Order #: [id number] Class: Historical Med   Order #: [id number] Class: Normal   Order #: [id number] Class: Normal   Order #: [id number] Class: Historical Med    Allergies Penicillins  Family History  Problem Relation Age of Onset   Hypertension Mother    Heart disease Brother    Cancer Sister     Social History Social History   Tobacco Use   Smoking status: Former Smoker    Quit date: 10/08/1971    Years since quitting: 47.6   Smokeless tobacco: Never Used  Substance Use Topics   Alcohol use: No    Alcohol/week: 0.0 standard drinks   Drug use: No    Review of Systems  All other systems negative except as documented in the HPI. All pertinent positives and negatives as reviewed in the HPI. ____________________________________________   PHYSICAL EXAM:  VITAL SIGNS: ED Triage Vitals  Enc Vitals Group     BP 05/10/19 1918 (!) 115/56     Pulse Rate 05/10/19 1918 93     Resp 05/10/19 1918 17     Temp 05/10/19 1918 98.4 F (  36.9 C)     Temp Source 05/10/19 1918 Oral     SpO2 05/10/19 1918 98 %     Weight 05/11/19 0457 176 lb (79.8 kg)     Height 05/11/19 0457 5\' 10"  (1.778 m)     Head Circumference --      Peak Flow --      Pain Score 05/10/19 1937 0     Pain Loc --      Pain Edu? --      Excl. in [place]? --     Constitutional: Alert and oriented. Well appearing and in no acute distress. Eyes: Conjunctivae are normal. PERRL. EOMI. Head: Atraumatic. Nose: No congestion/rhinnorhea. Mouth/Throat: Mucous membranes are moist.  Oropharynx non-erythematous. Neck: No stridor.  No meningeal signs.   Cardiovascular: Normal rate, regular rhythm. Good peripheral circulation. Grossly  normal heart sounds.   Respiratory: Normal respiratory effort.  No retractions. Lungs CTAB. Gastrointestinal: Soft and nontender. No distention.  Musculoskeletal: No lower extremity tenderness nor edema. No gross deformities of extremities. Neurologic:  Normal speech and language. No gross focal neurologic deficits are appreciated.  Skin:  Skin is warm, dry and intact. No rash noted.   ____________________________________________   LABS (all labs ordered are listed, but only abnormal results are displayed)  Labs Reviewed  BASIC METABOLIC PANEL - Abnormal; Notable for the following components:      Result Value   Sodium 129 (*)    Chloride 88 (*)    Glucose, Bld 117 (*)    BUN 28 (*)    Creatinine, Ser 1.38 (*)    Calcium 8.6 (*)    GFR calc non Af Amer 46 (*)    GFR calc Af Amer 53 (*)    All other components within normal limits  CBC - Abnormal; Notable for the following components:   WBC 15.2 (*)    RBC 3.87 (*)    Hemoglobin 11.5 (*)    HCT 34.3 (*)    Platelets 537 (*)    All other components within normal limits  CBG MONITORING, ED - Abnormal; Notable for the following components:   Glucose-Capillary 105 (*)    All other components within normal limits  LIPASE, BLOOD  URINALYSIS, ROUTINE W REFLEX MICROSCOPIC   ____________________________________________  EKG   EKG Interpretation  Date/Time:  Thursday May 11 2019 04:32:32 EDT Ventricular Rate:  123 PR Interval:    QRS Duration: 111 QT Interval:  335 QTC Calculation: 480 R Axis:   -48 Text Interpretation:  Ectopic Atrial  tachycardia Ventricular premature complex Incomplete RBBB and LAFB Right ventricular hypertrophy Borderline prolonged QT interval No old tracing to compare Confirmed by Merrily Pew [phone]) on 05/11/2019 6:10:20 AM      ____________________________________________  RADIOLOGY  Dg Chest 2 View  Result Date: 05/10/2019 CLINICAL DATA:  Cough. EXAM: CHEST - 2 VIEW COMPARISON:  Jan 09, 2014 FINDINGS: Cardiomediastinal silhouette is normal. Mediastinal contours appear intact. There is no evidence of focal airspace consolidation, pleural effusion or pneumothorax. Bilateral calcified pleural plaques are noted. Osseous structures are without acute abnormality. Soft tissues are grossly normal. IMPRESSION: 1. No active cardiopulmonary disease. 2. Bilateral calcified pleural plaques. Electronically Signed   By: Fidela Salisbury M.D.   On: 05/10/2019 20:13   Ct Chest W Contrast  Result Date: 05/11/2019 CLINICAL DATA:  Acute respiratory illness, productive cough wheezing and decreased fluid and fluid intake. History of melanoma, hypotension, asbestos exposure EXAM: CT CHEST, ABDOMEN, AND PELVIS WITH CONTRAST TECHNIQUE: Multidetector CT  imaging of the chest, abdomen and pelvis was performed following the standard protocol during bolus administration of intravenous contrast. CONTRAST:  160mL OMNIPAQUE IOHEXOL 300 MG/ML  SOLN COMPARISON:  CT chest March 01, 2008 FINDINGS: CT CHEST FINDINGS Cardiovascular: Atherosclerotic plaque within the normal caliber aorta. Shared origin of the brachiocephalic and common carotid artery. Calcifications noted in the proximal great vessels. Pulmonary arteries are normal caliber. Normal heart size. No pericardial effusion. Atherosclerotic calcification of the coronary arteries. Aortic leaflet calcifications are present. Mediastinum/Nodes: There is diffuse low attenuation, circumferential thickening of the thoracic esophagus with a small sliding-type hiatal hernia. 11 mm paraesophageal lymph node is present near the diaphragmatic hiatus (3/45). Thyroid gland and thoracic inlet are unremarkable. Trachea is free of acute abnormality. No other suspicious mediastinal, hilar or axillary lymph nodes. Lungs/Pleura: There are extensive scattered calcified pleural plaques which are similar in extent to the study from 2009. No consolidation, features of edema, pneumothorax, or  effusion. No suspicious pulmonary nodules or masses. Musculoskeletal: Exaggeration of the thoracic kyphosis with multilevel degenerative changes in the imaged portions of the spine. No suspicious osseous lesions are evident. CT ABDOMEN PELVIS FINDINGS Hepatobiliary: Diffuse hepatic hypoattenuation compatible with hepatic steatosis. No focal liver abnormality is seen. Calcified gallstones layering dependently within the gallbladder. No gallbladder wall thickening. No biliary ductal dilatation or calcified intraductal gallstones. Pancreas: Atrophic appearance of the pancreas. No ductal dilatation. Spleen: Normal in size without focal abnormality. Adrenals/Urinary Tract: Normal adrenal glands. Atrophic appearance of the kidneys with mild bilateral symmetric perinephric stranding, a nonspecific finding though may correlate with either age or decreased renal function. Few tiny subcentimeter hypoattenuating foci too small to fully characterize on CT imaging but statistically likely benign. Kidneys are otherwise unremarkable, without renal calculi, suspicious lesion, or hydronephrosis. Circumferential bladder wall thickening though may in part be due to underdistention. Stomach/Bowel: Small sliding-type hiatal hernia, as above. Distal stomach and duodenum are unremarkable. Edematous mural thickening of the small bowel. Appendix is definitively identified. No colonic wall thickening or dilatation. Vascular/Lymphatic: Atherosclerotic plaque within the normal caliber aorta. Mild ostial narrowing of the renal arteries. No aneurysm or ectasia. No suspicious or enlarged lymph nodes in the included lymphatic chains. Reproductive: The prostate and seminal vesicles are unremarkable. Other: There is a moderate volume of low-attenuation (10 HU) intraperitoneal fluid with suspicious nodular, irregular peritoneal thickening most pronounced along the inferior and posterior extent of this collection. No free air. No bowel containing  hernias. Musculoskeletal: Multilevel degenerative changes are present in the imaged portions of the spine. Straightening of the normal lumbar lordosis few large Schmorl's node formations are present most pronounced at L3. 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 No acute osseous abnormality or suspicious osseous lesion. Mild dextrocurvature of the spine. IMPRESSION: 1. New moderate volume of low-attenuation intraperitoneal fluid with suspicious nodular, irregular peritoneal thickening most pronounced along the inferior and posterior extent of this collection, given the patient's history of asbestos related exposure in calcified pleural disease the overall appearance of this finding is suspicious for malignant peritoneal mesothelioma. Additional differential consideration includes peritoneal carcinomatosis with malignant ascites. 2. Diffuse low attenuation, circumferential thickening of the thoracic esophagus with small adjacent 11 mm paraesophageal lymph node near the diaphragmatic hiatus, findings could reflect an esophagitis though given above features malignancy should be excluded. Consider further evaluation with endoscopy. 3. Circumferential bladder wall thickening though may in part be due to underdistention. Correlate with urinalysis to exclude cystitis. 4. Cholelithiasis at evidence of acute cholecystitis. 5. Aortic Atherosclerosis (ICD10-I70.0).  These results were called by telephone at the time of interpretation on 05/11/2019 at 5:50 am to Dr. Merrily Pew , who verbally acknowledged these results. Electronically Signed   By: Lovena Le M.D.   On: 05/11/2019 05:50   Ct Abdomen Pelvis W Contrast  Result Date: 05/11/2019 CLINICAL DATA:  Acute respiratory illness, productive cough wheezing and decreased fluid and fluid intake. History of melanoma, hypotension, asbestos exposure EXAM: CT CHEST, ABDOMEN, AND PELVIS WITH CONTRAST TECHNIQUE: Multidetector CT imaging of the chest, abdomen and  pelvis was performed following the standard protocol during bolus administration of intravenous contrast. CONTRAST:  173mL OMNIPAQUE IOHEXOL 300 MG/ML  SOLN COMPARISON:  CT chest March 01, 2008 FINDINGS: CT CHEST FINDINGS Cardiovascular: Atherosclerotic plaque within the normal caliber aorta. Shared origin of the brachiocephalic and common carotid artery. Calcifications noted in the proximal great vessels. Pulmonary arteries are normal caliber. Normal heart size. No pericardial effusion. Atherosclerotic calcification of the coronary arteries. Aortic leaflet calcifications are present. Mediastinum/Nodes: There is diffuse low attenuation, circumferential thickening of the thoracic esophagus with a small sliding-type hiatal hernia. 11 mm paraesophageal lymph node is present near the diaphragmatic hiatus (3/45). Thyroid gland and thoracic inlet are unremarkable. Trachea is free of acute abnormality. No other suspicious mediastinal, hilar or axillary lymph nodes. Lungs/Pleura: There are extensive scattered calcified pleural plaques which are similar in extent to the study from 2009. No consolidation, features of edema, pneumothorax, or effusion. No suspicious pulmonary nodules or masses. Musculoskeletal: Exaggeration of the thoracic kyphosis with multilevel degenerative changes in the imaged portions of the spine. No suspicious osseous lesions are evident. CT ABDOMEN PELVIS FINDINGS Hepatobiliary: Diffuse hepatic hypoattenuation compatible with hepatic steatosis. No focal liver abnormality is seen. Calcified gallstones layering dependently within the gallbladder. No gallbladder wall thickening. No biliary ductal dilatation or calcified intraductal gallstones. Pancreas: Atrophic appearance of the pancreas. No ductal dilatation. Spleen: Normal in size without focal abnormality. Adrenals/Urinary Tract: Normal adrenal glands. Atrophic appearance of the kidneys with mild bilateral symmetric perinephric stranding, a nonspecific  finding though may correlate with either age or decreased renal function. Few tiny subcentimeter hypoattenuating foci too small to fully characterize on CT imaging but statistically likely benign. Kidneys are otherwise unremarkable, without renal calculi, suspicious lesion, or hydronephrosis. Circumferential bladder wall thickening though may in part be due to underdistention. Stomach/Bowel: Small sliding-type hiatal hernia, as above. Distal stomach and duodenum are unremarkable. Edematous mural thickening of the small bowel. Appendix is definitively identified. No colonic wall thickening or dilatation. Vascular/Lymphatic: Atherosclerotic plaque within the normal caliber aorta. Mild ostial narrowing of the renal arteries. No aneurysm or ectasia. No suspicious or enlarged lymph nodes in the included lymphatic chains. Reproductive: The prostate and seminal vesicles are unremarkable. Other: There is a moderate volume of low-attenuation (10 HU) intraperitoneal fluid with suspicious nodular, irregular peritoneal thickening most pronounced along the inferior and posterior extent of this collection. No free air. No bowel containing hernias. Musculoskeletal: Multilevel degenerative changes are present in the imaged portions of the spine. Straightening of the normal lumbar lordosis few large Schmorl's node formations are present most pronounced at L3. 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 No acute osseous abnormality or suspicious osseous lesion. Mild dextrocurvature of the spine. IMPRESSION: 1. New moderate volume of low-attenuation intraperitoneal fluid with suspicious nodular, irregular peritoneal thickening most pronounced along the inferior and posterior extent of this collection, given the patient's history of asbestos related exposure in calcified pleural disease the overall appearance of this finding is suspicious  for malignant peritoneal mesothelioma. Additional differential consideration  includes peritoneal carcinomatosis with malignant ascites. 2. Diffuse low attenuation, circumferential thickening of the thoracic esophagus with small adjacent 11 mm paraesophageal lymph node near the diaphragmatic hiatus, findings could reflect an esophagitis though given above features malignancy should be excluded. Consider further evaluation with endoscopy. 3. Circumferential bladder wall thickening though may in part be due to underdistention. Correlate with urinalysis to exclude cystitis. 4. Cholelithiasis at evidence of acute cholecystitis. 5. Aortic Atherosclerosis (ICD10-I70.0). These results were called by telephone at the time of interpretation on 05/11/2019 at 5:50 am to Dr. Merrily Pew , who verbally acknowledged these results. Electronically Signed   By: Lovena Le M.D.   On: 05/11/2019 05:50   ____________________________________________   PROCEDURES  Procedure(s) performed:   Procedures  ____________________________________________   INITIAL IMPRESSION / ASSESSMENT AND PLAN / ED COURSE  Here with weight loss, ascites concerning for metastases from likely pulmonary vs GI origin. Likely cause for all symptoms. Will defer to Onc for further workup. Nausea improved with zofran.  Likely EAT on ECG, improves with fluids. Will defer to cardiology for need for anticoagulation in patieent who is high fall risk, new CA diagnosis.  Discussed all of this extensively with patient and daughter at bedside.   HR improved with fluids.   Messages sent in epic to PCP and oncologist to help navigate follow up.    Pertinent labs & imaging results that were available during my care of the patient were reviewed by me and considered in my medical decision making (see chart for details).  A medical screening exam was performed and I feel the patient has had an appropriate workup for their chief complaint at this time and likelihood of emergent condition existing is low. They have been counseled  on decision, discharge, follow up and which symptoms necessitate immediate return to the emergency department. They or their family verbally stated understanding and agreement with plan and discharged in stable condition.   ____________________________________________  FINAL CLINICAL IMPRESSION(S) / ED DIAGNOSES  Final diagnoses:  None     MEDICATIONS GIVEN DURING THIS VISIT:  Medications  sodium chloride flush (NS) 0.9 % injection 3 mL (3 mLs Intravenous Not Given 05/11/19 0407)  ondansetron (ZOFRAN) injection 4 mg (4 mg Intravenous Given 05/11/19 0455)  alum & mag hydroxide-simeth (MAALOX/MYLANTA) 200-200-20 MG/5ML suspension 30 mL (30 mLs Oral Given 05/11/19 0455)  iohexol (OMNIPAQUE) 300 MG/ML solution 100 mL (100 mLs Intravenous Contrast Given 05/11/19 0507)  lactated ringers bolus 1,000 mL (1,000 mLs Intravenous New Bag/Given 05/11/19 0608)     NEW OUTPATIENT MEDICATIONS STARTED DURING THIS VISIT:  New Prescriptions   No medications on file    Note:  This note was prepared with assistance of Dragon voice recognition software. Occasional wrong-word or sound-a-like substitutions may have occurred due to the inherent limitations of voice recognition software.   Kelsea Mousel, Corene Cornea, MD 05/11/19 [phone]

## 2019-05-11 NOTE — Telephone Encounter (Signed)
Patients family member calling to say that he was not doing any better but worse since yesterdays visit  Said that they were going to call ems, looks like he is in er at this time now, but wanted to let you know the situation

## 2019-05-11 NOTE — Telephone Encounter (Signed)
I received a staff msg from Dr. Jana Hakim to schedule Mr. Score to see him on 9/11 at 930am with labs afterwards at 10am. I cld and lft Mr. Solly a vm w/the appt date and time. I update Dr. Samella Parr nurse to try to reach out to the pt as well.

## 2019-05-11 NOTE — Telephone Encounter (Signed)
Please see result note. Patient went to the ER on 05/10/2019 and was discharged.

## 2019-05-14 ENCOUNTER — Encounter (HOSPITAL_COMMUNITY): Payer: Self-pay | Admitting: *Deleted

## 2019-05-14 ENCOUNTER — Emergency Department (HOSPITAL_COMMUNITY): Payer: Medicare Other

## 2019-05-14 ENCOUNTER — Other Ambulatory Visit: Payer: Self-pay

## 2019-05-14 ENCOUNTER — Inpatient Hospital Stay (HOSPITAL_COMMUNITY)
Admission: EM | Admit: 2019-05-14 | Discharge: 2019-05-18 | DRG: 682 | Disposition: A | Discharge status: Home / Self Care | Payer: Medicare Other | Attending: Family Medicine | Admitting: Family Medicine

## 2019-05-14 DIAGNOSIS — E039 Hypothyroidism, unspecified: Secondary | ICD-10-CM | POA: Diagnosis present

## 2019-05-14 DIAGNOSIS — R778 Other specified abnormalities of plasma proteins: Secondary | ICD-10-CM | POA: Diagnosis present

## 2019-05-14 DIAGNOSIS — H269 Unspecified cataract: Secondary | ICD-10-CM | POA: Diagnosis present

## 2019-05-14 DIAGNOSIS — N179 Acute kidney failure, unspecified: Principal | ICD-10-CM | POA: Diagnosis present

## 2019-05-14 DIAGNOSIS — Z6826 Body mass index (BMI) 26.0-26.9, adult: Secondary | ICD-10-CM

## 2019-05-14 DIAGNOSIS — E871 Hypo-osmolality and hyponatremia: Secondary | ICD-10-CM | POA: Diagnosis present

## 2019-05-14 DIAGNOSIS — C159 Malignant neoplasm of esophagus, unspecified: Secondary | ICD-10-CM | POA: Diagnosis present

## 2019-05-14 DIAGNOSIS — D630 Anemia in neoplastic disease: Secondary | ICD-10-CM | POA: Diagnosis present

## 2019-05-14 DIAGNOSIS — D649 Anemia, unspecified: Secondary | ICD-10-CM

## 2019-05-14 DIAGNOSIS — H409 Unspecified glaucoma: Secondary | ICD-10-CM | POA: Diagnosis present

## 2019-05-14 DIAGNOSIS — E559 Vitamin D deficiency, unspecified: Secondary | ICD-10-CM | POA: Diagnosis present

## 2019-05-14 DIAGNOSIS — C459 Mesothelioma, unspecified: Secondary | ICD-10-CM | POA: Diagnosis not present

## 2019-05-14 DIAGNOSIS — Z5329 Procedure and treatment not carried out because of patient's decision for other reasons: Secondary | ICD-10-CM | POA: Diagnosis present

## 2019-05-14 DIAGNOSIS — E860 Dehydration: Secondary | ICD-10-CM | POA: Diagnosis not present

## 2019-05-14 DIAGNOSIS — R0689 Other abnormalities of breathing: Secondary | ICD-10-CM

## 2019-05-14 DIAGNOSIS — E43 Unspecified severe protein-calorie malnutrition: Secondary | ICD-10-CM | POA: Diagnosis present

## 2019-05-14 DIAGNOSIS — Z87891 Personal history of nicotine dependence: Secondary | ICD-10-CM

## 2019-05-14 DIAGNOSIS — Z8249 Family history of ischemic heart disease and other diseases of the circulatory system: Secondary | ICD-10-CM

## 2019-05-14 DIAGNOSIS — Z85820 Personal history of malignant melanoma of skin: Secondary | ICD-10-CM

## 2019-05-14 DIAGNOSIS — I10 Essential (primary) hypertension: Secondary | ICD-10-CM | POA: Diagnosis present

## 2019-05-14 DIAGNOSIS — Z880 Allergy status to penicillin: Secondary | ICD-10-CM

## 2019-05-14 DIAGNOSIS — K2289 Other specified disease of esophagus: Secondary | ICD-10-CM

## 2019-05-14 DIAGNOSIS — R1310 Dysphagia, unspecified: Secondary | ICD-10-CM | POA: Diagnosis present

## 2019-05-14 DIAGNOSIS — R0600 Dyspnea, unspecified: Secondary | ICD-10-CM

## 2019-05-14 DIAGNOSIS — Z20828 Contact with and (suspected) exposure to other viral communicable diseases: Secondary | ICD-10-CM | POA: Diagnosis present

## 2019-05-14 DIAGNOSIS — I959 Hypotension, unspecified: Secondary | ICD-10-CM | POA: Diagnosis not present

## 2019-05-14 DIAGNOSIS — Z79899 Other long term (current) drug therapy: Secondary | ICD-10-CM

## 2019-05-14 DIAGNOSIS — N183 Chronic kidney disease, stage 3 (moderate): Secondary | ICD-10-CM | POA: Diagnosis present

## 2019-05-14 DIAGNOSIS — I129 Hypertensive chronic kidney disease with stage 1 through stage 4 chronic kidney disease, or unspecified chronic kidney disease: Secondary | ICD-10-CM | POA: Diagnosis present

## 2019-05-14 DIAGNOSIS — R188 Other ascites: Secondary | ICD-10-CM

## 2019-05-14 DIAGNOSIS — Z801 Family history of malignant neoplasm of trachea, bronchus and lung: Secondary | ICD-10-CM

## 2019-05-14 DIAGNOSIS — C786 Secondary malignant neoplasm of retroperitoneum and peritoneum: Secondary | ICD-10-CM | POA: Diagnosis present

## 2019-05-14 DIAGNOSIS — E785 Hyperlipidemia, unspecified: Secondary | ICD-10-CM | POA: Diagnosis present

## 2019-05-14 DIAGNOSIS — R180 Malignant ascites: Secondary | ICD-10-CM | POA: Diagnosis present

## 2019-05-14 DIAGNOSIS — M1990 Unspecified osteoarthritis, unspecified site: Secondary | ICD-10-CM | POA: Diagnosis present

## 2019-05-14 DIAGNOSIS — R7989 Other specified abnormal findings of blood chemistry: Secondary | ICD-10-CM | POA: Diagnosis present

## 2019-05-14 DIAGNOSIS — R112 Nausea with vomiting, unspecified: Secondary | ICD-10-CM

## 2019-05-14 DIAGNOSIS — R972 Elevated prostate specific antigen [PSA]: Secondary | ICD-10-CM | POA: Diagnosis present

## 2019-05-14 DIAGNOSIS — K228 Other specified diseases of esophagus: Secondary | ICD-10-CM

## 2019-05-14 DIAGNOSIS — Z77090 Contact with and (suspected) exposure to asbestos: Secondary | ICD-10-CM | POA: Diagnosis present

## 2019-05-14 LAB — COMPREHENSIVE METABOLIC PANEL
ALT: 17 U/L (ref 0–44)
AST: 28 U/L (ref 15–41)
Albumin: 2.1 g/dL — ABNORMAL LOW (ref 3.5–5.0)
Alkaline Phosphatase: 79 U/L (ref 38–126)
Anion gap: 12 (ref 5–15)
BUN: 41 mg/dL — ABNORMAL HIGH (ref 8–23)
CO2: 28 mmol/L (ref 22–32)
Calcium: 7.6 mg/dL — ABNORMAL LOW (ref 8.9–10.3)
Chloride: 91 mmol/L — ABNORMAL LOW (ref 98–111)
Creatinine, Ser: 1.76 mg/dL — ABNORMAL HIGH (ref 0.61–1.24)
GFR calc Af Amer: 39 mL/min — ABNORMAL LOW (ref >60)
GFR calc non Af Amer: 34 mL/min — ABNORMAL LOW (ref >60)
Glucose, Bld: 121 mg/dL — ABNORMAL HIGH (ref 70–99)
Potassium: 4.2 mmol/L (ref 3.5–5.1)
Sodium: 131 mmol/L — ABNORMAL LOW (ref 135–145)
Total Bilirubin: 0.3 mg/dL (ref 0.3–1.2)
Total Protein: 6.1 g/dL — ABNORMAL LOW (ref 6.5–8.1)

## 2019-05-14 LAB — CBC
HCT: 33 % — ABNORMAL LOW (ref 39.0–52.0)
Hemoglobin: 10.8 g/dL — ABNORMAL LOW (ref 13.0–17.0)
MCH: 29.4 pg (ref 26.0–34.0)
MCHC: 32.7 g/dL (ref 30.0–36.0)
MCV: 89.9 fL (ref 80.0–100.0)
Platelets: 391 10*3/uL (ref 150–400)
RBC: 3.67 MIL/uL — ABNORMAL LOW (ref 4.22–5.81)
RDW: 13.4 % (ref 11.5–15.5)
WBC: 16 10*3/uL — ABNORMAL HIGH (ref 4.0–10.5)
nRBC: 0 % (ref 0.0–0.2)

## 2019-05-14 LAB — TROPONIN I (HIGH SENSITIVITY): Troponin I (High Sensitivity): 38 ng/L — ABNORMAL HIGH (ref <18)

## 2019-05-14 LAB — LIPASE, BLOOD: Lipase: 33 U/L (ref 11–51)

## 2019-05-14 MED ORDER — ACETAMINOPHEN 650 MG RE SUPP: 650.0000 mg | Freq: Four times a day (QID) | RECTAL | Status: DC | PRN

## 2019-05-14 MED ORDER — ENOXAPARIN SODIUM 30 MG/0.3ML ~~LOC~~ SOLN
30.0000 mg | SUBCUTANEOUS | Status: DC
  Administered 2019-05-15: 30 mg via SUBCUTANEOUS
  Filled 2019-05-14: qty 0.3

## 2019-05-14 MED ORDER — PRO-STAT SUGAR FREE PO LIQD
30.0000 mL | Freq: Two times a day (BID) | ORAL | Status: DC
  Administered 2019-05-15 – 2019-05-16 (×3): 30 mL via ORAL
  Filled 2019-05-14 (×7): qty 30

## 2019-05-14 MED ORDER — SODIUM CHLORIDE 0.9 % IV BOLUS
1000.0000 mL | Freq: Once | INTRAVENOUS | Status: AC
  Administered 2019-05-14: 1000 mL via INTRAVENOUS

## 2019-05-14 MED ORDER — ATENOLOL 25 MG PO TABS
12.5000 mg | ORAL_TABLET | Freq: Every day | ORAL | Status: DC
  Administered 2019-05-15 – 2019-05-16 (×3): 12.5 mg via ORAL
  Filled 2019-05-14 (×3): qty 1

## 2019-05-14 MED ORDER — SODIUM CHLORIDE 0.9 % IV SOLN
INTRAVENOUS | Status: DC
  Administered 2019-05-14 (×2): via INTRAVENOUS

## 2019-05-14 MED ORDER — ONDANSETRON HCL 4 MG/2ML IJ SOLN
4.0000 mg | Freq: Once | INTRAMUSCULAR | Status: AC | PRN
  Administered 2019-05-14: 4 mg via INTRAVENOUS
  Filled 2019-05-14: qty 2

## 2019-05-14 MED ORDER — ACETAMINOPHEN 325 MG PO TABS: 650.0000 mg | ORAL_TABLET | Freq: Four times a day (QID) | ORAL | Status: DC | PRN

## 2019-05-14 MED ORDER — ZOLPIDEM TARTRATE 5 MG PO TABS
5.0000 mg | ORAL_TABLET | Freq: Every evening | ORAL | Status: DC | PRN
  Administered 2019-05-15: 5 mg via ORAL
  Filled 2019-05-14: qty 1

## 2019-05-14 NOTE — ED Triage Notes (Signed)
No bm for 2 weeks, emesis for past week, feels weak.  Seen at [facility] earlier.

## 2019-05-14 NOTE — H&P (Signed)
TRH H&P    Patient Demographics:    Thomas [name], is a 85 y.o. male  MRN: [id number]  DOB - 1930/05/13  Admit Date - 05/14/2019  Referring MD/NP/PA: Sherwood Gambler  Outpatient Primary MD for the patient is Pickard, Cammie Mcgee, MD [facility] - oncology  Patient coming from:  home  Chief complaint-  Emesis, generalized weakness   HPI:    Thomas [name]  is a 85 y.o. male, w hypertension, hyperlipidemia, anemia, vitamin D deficiency,  elevated Psa (could not confirm , labs in Epic normal),  h/o melanoma, recent new diagnosis of mesothelioma w ascites , apparently presents with c/o nausea , emesis and weakness over the past 3-4weeks.  Pt denies fever, chills, cough, cp, palp, sob, abd pain, diarrhea, brbpr, dysuria, hematuria.   In ED,  T 98 P 102, R 18, Bp 99/56  Pox 97% on RA Wt 77.6 kg  Xray Abdomen,  IMPRESSION: 1. Nonobstructive bowel gas pattern. 2. Moderate volume ascites.  Wbc 16.0, Hgb 10.8, Plt 391 Na 131, K 4.2, Bun 41, Creatinine 1.76 Alb 2.1 Ast 28, Alt 17, Alk phos 79, T. Bili 0.3  Urinalysis pending.    EKG nsr at 100, nl axis, st depression in v4-6 Trop 38  Pt will be admitted for n/v, and dehydration and acute renal failure.     Review of systems:    In addition to the HPI above,  No Fever-chills, No Headache, No changes with Vision or hearing, No problems swallowing food or Liquids, No Chest pain, Cough or Shortness of Breath, No Abdominal pain,  bowel movements are regular, No Blood in stool or Urine, No dysuria, No new skin rashes or bruises, No new joints pains-aches,  No new weakness, tingling, numbness in any extremity, No recent weight gain or loss, No polyuria, polydypsia or polyphagia, No significant Mental Stressors.  All other systems reviewed and are negative.    Past History of the following :    Past Medical History:  Diagnosis Date    ANEMIA 02/08/2008   ARTHRITIS 02/08/2008   Cataract    left eye   CONTRACTURE OF TENDON 02/08/2008   ELEVATED PROSTATE SPECIFIC ANTIGEN 02/08/2008   GLAUCOMA, LEFT EYE 02/07/2009   left eye   HISTORY OF ASBESTOS EXPOSURE 02/08/2008   HYPERLIPIDEMIA 02/08/2008   Hypertension    HYPOTENSION 02/18/2010   HYPOTHYROIDISM 02/08/2008   Melanoma ([place])    left shoulder   VITAMIN D DEFICIENCY 02/07/2009      Past Surgical History:  Procedure Laterality Date   dupyreenes contractures-bilat        Social History:      Social History   Tobacco Use   Smoking status: Former Smoker    Quit date: 10/08/1971    Years since quitting: 47.6   Smokeless tobacco: Never Used  Substance Use Topics   Alcohol use: No    Alcohol/week: 0.0 standard drinks       Family History :     Family History  Problem Relation Age of Onset   Hypertension  Mother    Heart disease Brother    Cancer Sister        Home Medications:   Prior to Admission medications   Medication Sig Start Date End Date Taking? Authorizing Provider  amLODipine (NORVASC) 5 MG tablet TAKE ONE TABLET BY MOUTH ONCE DAILY. Patient taking differently: Take 5 mg by mouth daily.  09/28/18  Yes Susy Frizzle, MD  atenolol-chlorthalidone (TENORETIC) 50-25 MG tablet TAKE 1/2 TABLET BY MOUTH EVERY MORNING. Patient taking differently: Take 0.5 tablets by mouth daily.  03/31/19  Yes Susy Frizzle, MD  latanoprost (XALATAN) 0.005 % ophthalmic solution Place 1 drop into both eyes at bedtime.    Yes [provider]  Multiple Vitamins-Minerals (EYE VITAMINS) CAPS Take 1 capsule by mouth daily.    Yes [provider]  ondansetron (ZOFRAN ODT) 4 MG disintegrating tablet Take 1 tablet (4 mg total) by mouth every 8 (eight) hours as needed for vomiting. 23m ODT q4 hours prn nausea/vomit 05/11/19  Yes Mesner, JCorene Cornea MD  pantoprazole (PROTONIX) 20 MG tablet Take 1 tablet (20 mg total) by mouth daily. 05/11/19  Yes Mesner,  JCorene Cornea MD  fluticasone (FLONASE) 50 MCG/ACT nasal spray Place 2 sprays into both nostrils daily. 05/01/19 05/11/19  PSusy Frizzle MD  levocetirizine (XYZAL) 5 MG tablet Take 1 tablet (5 mg total) by mouth every evening. 05/01/19 05/11/19  PSusy Frizzle MD     Allergies:     Allergies  Allergen Reactions   Penicillins Rash    REACTION: rash     Physical Exam:   Vitals  Blood pressure 117/66, pulse (!) 102, temperature 98 F (36.7 C), temperature source Oral, resp. rate 18, height _0  (1.753 m), weight 77.6 kg, SpO2 97 %.  1.  General: axoxo3  2. Psychiatric: euthymic  3. Neurologic: Cn2-12 intact, reflexes 2+ symmetric, diffuse with no clonus, motor 5/5 in all 4 ext  4. HEENMT:  Anicteric, pupils 1.561msymmetric, direct, consensual, near intact Neck: no jvd  5. Respiratory : CTAB  6. Cardiovascular : rrr s1, s2, 2/6 sem llsb  7. Gastrointestinal:  Abd: soft, nt, slight distension,  +bs  8. Skin:  Ext: no c/c/e, slight hammer toes, onychomycosis  9.Musculoskeletal:  Good ROM,     Data Review:    CBC Recent Labs  Lab 05/08/19 1132 05/10/19 2002 05/14/19 1843  WBC 16.0* 15.2* 16.0*  HGB 11.6* 11.5* 10.8*  HCT 34.0* 34.3* 33.0*  PLT 528* 537* 391  MCV 86.5 88.6 89.9  MCH 29.5 29.7 29.4  MCHC 34.1 33.5 32.7  RDW 12.1 13.2 13.4  LYMPHSABS 736*  --   --   EOSABS 16  --   --   BASOSABS 32  --   --    ------------------------------------------------------------------------------------------------------------------  Results for orders placed or performed during the hospital encounter of 05/14/19 (from the past 48 hour(s))  Lipase, blood     Status: None   Collection Time: 05/14/19  6:43 PM  Result Value Ref Range   Lipase 33 11 - 51 U/L    Comment: [facility] [place] [id number] metabolic panel     Status: Abnormal   Collection Time: 05/14/19  6:43 PM  Result Value Ref Range   Sodium 131  (L) 135 - 145 mmol/L   Potassium 4.2 3.5 - 5.1 mmol/L   Chloride 91 (L) 98 - 111 mmol/L   CO2 28 22 - 32 mmol/L   Glucose, Bld  121 (H) 70 - 99 mg/dL   BUN 41 (H) 8 - 23 mg/dL   Creatinine, Ser 1.76 (H) 0.61 - 1.24 mg/dL   Calcium 7.6 (L) 8.9 - 10.3 mg/dL   Total Protein 6.1 (L) 6.5 - 8.1 g/dL   Albumin 2.1 (L) 3.5 - 5.0 g/dL   AST 28 15 - 41 U/L   ALT 17 0 - 44 U/L   Alkaline Phosphatase 79 38 - 126 U/L   Total Bilirubin 0.3 0.3 - 1.2 mg/dL   GFR calc non Af Amer 34 (L) >60 mL/min   GFR calc Af Amer 39 (L) >60 mL/min   Anion gap 12 5 - 15    Comment: Performed at [facility], [address] [place], [place] [address]  CBC     Status: Abnormal   Collection Time: 05/14/19  6:43 PM  Result Value Ref Range   WBC 16.0 (H) 4.0 - 10.5 K/uL   RBC 3.67 (L) 4.22 - 5.81 MIL/uL   Hemoglobin 10.8 (L) 13.0 - 17.0 g/dL   HCT 33.0 (L) 39.0 - 52.0 %   MCV 89.9 80.0 - 100.0 fL   MCH 29.4 26.0 - 34.0 pg   MCHC 32.7 30.0 - 36.0 g/dL   RDW 13.4 11.5 - 15.5 %   Platelets 391 150 - 400 K/uL   nRBC 0.0 0.0 - 0.2 %    Comment: Performed at [facility], [address] [place], [place] [address]    Chemistries  Recent Labs  Lab 05/08/19 1132 05/10/19 2002 05/14/19 1843  NA 130* 129* 131*  K 4.9 4.4 4.2  CL 88* 88* 91*  CO2 _0 GLUCOSE 116* 117* 121*  BUN 30* 28* 41*  CREATININE 1.27* 1.38* 1.76*  CALCIUM 8.7 8.6* 7.6*  AST 25  --  28  ALT 14  --  17  ALKPHOS  --   --  79  BILITOT 0.5  --  0.3   ------------------------------------------------------------------------------------------------------------------  ------------------------------------------------------------------------------------------------------------------ GFR: Estimated Creatinine Clearance: 29.6 mL/min (A) (by C-G formula based on SCr of 1.76 mg/dL (H)). Liver Function Tests: Recent Labs  Lab 05/08/19 1132 05/14/19 1843  AST 25 28  ALT 14 17  ALKPHOS  --  79  BILITOT 0.5 0.3  PROT 5.8* 6.1*  ALBUMIN   --  2.1*   Recent Labs  Lab 05/11/19 0453 05/14/19 1843  LIPASE 30 33   No results for input(s): AMMONIA in the last 168 hours. Coagulation Profile: No results for input(s): INR, PROTIME in the last 168 hours. Cardiac Enzymes: No results for input(s): CKTOTAL, CKMB, CKMBINDEX, TROPONINI in the last 168 hours. BNP (last 3 results) No results for input(s): PROBNP in the last 8760 hours. HbA1C: No results for input(s): HGBA1C in the last 72 hours. CBG: Recent Labs  Lab 05/11/19 0424  GLUCAP 105*   Lipid Profile: No results for input(s): CHOL, HDL, LDLCALC, TRIG, CHOLHDL, LDLDIRECT in the last 72 hours. Thyroid Function Tests: No results for input(s): TSH, T4TOTAL, FREET4, T3FREE, THYROIDAB in the last 72 hours. Anemia Panel: No results for input(s): VITAMINB12, FOLATE, FERRITIN, TIBC, IRON, RETICCTPCT in the last 72 hours.  --------------------------------------------------------------------------------------------------------------- Urine analysis:    Component Value Date/Time   COLORURINE YELLOW 05/08/2019 1151   APPEARANCEUR CLEAR 05/08/2019 1151   LABSPEC 1.025 05/08/2019 1151   PHURINE 5.0 05/08/2019 1151   GLUCOSEU NEGATIVE 05/08/2019 1151   [place] (A) 05/08/2019 1151   HGBUR trace-lysed 03/19/2010 0818   BILIRUBINUR n 01/09/2014 1027  KETONESUR TRACE (A) 05/08/2019 1151   PROTEINUR 1+ (A) 05/08/2019 1151   UROBILINOGEN 0.2 01/09/2014 1027   UROBILINOGEN 0.2 03/19/2010 0818   NITRITE NEGATIVE 05/08/2019 1151   LEUKOCYTESUR NEGATIVE 05/08/2019 1151      Imaging Results:    Dg Abd Acute 2+v W 1v Chest  Result Date: 05/14/2019 CLINICAL DATA:  Vomiting. Constipation. EXAM: DG ABDOMEN ACUTE W/ 1V CHEST COMPARISON:  Body CT May 11, 2019 FINDINGS: Nonobstructive bowel gas pattern. Centralization of the bowels due to moderate volume ascites. Normal amount of formed stool throughout the colon. Normal cardiac silhouette. Partially calcified pleural plaques  noted. Calcific atherosclerotic disease of the aorta. IMPRESSION: 1. Nonobstructive bowel gas pattern. 2. Moderate volume ascites. Electronically Signed   By: Fidela Salisbury M.D.   On: 05/14/2019 19:31       Assessment & Plan:    Principal Problem:   ARF (acute renal failure) (HCC) Active Problems:   Essential hypertension   Hyponatremia   Mesothelioma ([place])   Protein-calorie malnutrition, severe ([place])  ARF likely secondary to dehydration Hydrate with ns iv Check cmp in am  Hyponatremia Hydrate with ns iv Check serum osm, cortisol, tsh Check urine sodium, urine osm  Tachycardia, Elevated troponin, ? Demand ischemia vs secondary to ARF  Cardiac murmer Check cpk Check d dimer, if positive the CTA chest r/o PE Cycle cardiac marker Check cardiac echo  Hypertension Pt state that he is off all bp medication Consider restarting atenolol once bp improves  Severe protein calorie malnutrition Pro stat 68m po bid  Ascites, mesothelioma Please f/u with excellent oncologist,  GLurline Del  DVT Prophylaxis-   Lovenox - SCDs   AM Labs Ordered, also please review Full Orders  Family Communication: Admission, patients condition and plan of care including tests being ordered have been discussed with the patient and  who indicate understanding and agree with the plan and Code Status.  Code Status:  FULL CODE per pt, daughter present in ED, during admission.   Admission status: Observation: Based on patients clinical presentation and evaluation of above clinical data, I have made determination that patient meets observation criteria at this time.   Time spent in minutes : 70 minutes   JJani GravelM.D on 05/14/2019 at 9:11 PM

## 2019-05-14 NOTE — ED Notes (Signed)
Pt reports vomiting for 1 week  Also constipation with unknown last BM  Reports "weak"

## 2019-05-14 NOTE — ED Provider Notes (Signed)
[facility] EMERGENCY DEPARTMENT Provider Note   CSN: [id number] Arrival date & time: 05/14/19  1820     History   Chief Complaint Chief Complaint  Patient presents with  . Emesis    for past week    HPI Thomas [name] is a 85 y.o. male.     HPI  85 year old male presents with vomiting and generalized weakness.  Has been ongoing for about 2 weeks.  He can drink some fluids but anytime he puts food and he develops vomiting with phlegm.  No cough.  No pain anywhere including no dysuria, chest pain, abdominal pain.  He does have some abdominal distention and about 4 days ago was diagnosed with malignant ascites.  No bowel movement during this time because he can't eat.  Past Medical History:  Diagnosis Date  . ANEMIA 02/08/2008  . ARTHRITIS 02/08/2008  . Cataract    left eye  . 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 ELEVATED PROSTATE SPECIFIC ANTIGEN 02/08/2008  . GLAUCOMA, LEFT EYE 02/07/2009   left eye  . HISTORY OF ASBESTOS EXPOSURE 02/08/2008  . HYPERLIPIDEMIA 02/08/2008  . Hypertension   . HYPOTENSION 02/18/2010  . HYPOTHYROIDISM 02/08/2008  . Melanoma ([place])    left shoulder  . Mesothelioma ([place]) 05/11/2019   on CT  . VITAMIN D DEFICIENCY 02/07/2009    Patient Active Problem List   Diagnosis Date Noted  . ARF (acute renal failure) ([place]) 05/14/2019  . Hyponatremia 05/14/2019  . Mesothelioma ([place]) 05/14/2019  . Protein-calorie malnutrition, severe ([place]) 05/14/2019  . Glaucoma 02/07/2009  . ARTHRITIS 02/08/2008  . ELEVATED PROSTATE SPECIFIC ANTIGEN 02/08/2008  . HISTORY OF ASBESTOS EXPOSURE 02/08/2008  . Essential hypertension 01/27/2007    Past Surgical History:  Procedure Laterality Date  . dupyreenes contractures-bilat          Home Medications    Prior to Admission medications   Medication Sig Start Date End Date Taking? Authorizing Provider  amLODipine (NORVASC) 5 MG tablet TAKE ONE TABLET BY MOUTH ONCE DAILY. Patient taking differently: Take 5 mg by mouth  daily.  09/28/18  Yes Susy Frizzle, MD  atenolol-chlorthalidone (TENORETIC) 50-25 MG tablet TAKE 1/2 TABLET BY MOUTH EVERY MORNING. Patient taking differently: Take 0.5 tablets by mouth daily.  03/31/19  Yes Susy Frizzle, MD  latanoprost (XALATAN) 0.005 % ophthalmic solution Place 1 drop into both eyes at bedtime.    Yes [provider]  Multiple Vitamins-Minerals (EYE VITAMINS) CAPS Take 1 capsule by mouth daily.    Yes [provider]  ondansetron (ZOFRAN ODT) 4 MG disintegrating tablet Take 1 tablet (4 mg total) by mouth every 8 (eight) hours as needed for vomiting. 4mg  ODT q4 hours prn nausea/vomit 05/11/19  Yes Mesner, Corene Cornea, MD  pantoprazole (PROTONIX) 20 MG tablet Take 1 tablet (20 mg total) by mouth daily. 05/11/19  Yes Mesner, Corene Cornea, MD  fluticasone (FLONASE) 50 MCG/ACT nasal spray Place 2 sprays into both nostrils daily. 05/01/19 05/11/19  Susy Frizzle, MD  levocetirizine (XYZAL) 5 MG tablet Take 1 tablet (5 mg total) by mouth every evening. 05/01/19 05/11/19  Susy Frizzle, MD    Family History Family History  Problem Relation Age of Onset  . Hypertension Mother   . Heart disease Brother   . Cancer Sister     Social History Social History   Tobacco Use  . Smoking status: Former Smoker    Quit date: 10/08/1971    Years since quitting: 47.6  . Smokeless tobacco: Never  Used  Substance Use Topics  . Alcohol use: No    Alcohol/week: 0.0 standard drinks  . Drug use: No     Allergies   Penicillins   Review of Systems Review of Systems  Constitutional: Negative for fever.  Respiratory: Negative for shortness of breath.   Cardiovascular: Negative for chest pain.  Gastrointestinal: Positive for abdominal distention, constipation and vomiting. Negative for abdominal pain and diarrhea.  Genitourinary: Negative for dysuria.  Neurological: Positive for weakness.  All other systems reviewed and are negative.    Physical Exam Updated Vital Signs  BP 126/71   Pulse (!) 105   Temp 98.4 F (36.9 C) (Oral)   Resp 18   Ht 5\' 9"  (1.753 m)   Wt 77.6 kg   SpO2 98%   BMI 25.25 kg/m   Physical Exam Vitals signs and nursing note reviewed.  Constitutional:      General: He is not in acute distress.    Appearance: He is well-developed. He is not ill-appearing or diaphoretic.  HENT:     Head: Normocephalic and atraumatic.     Right Ear: External ear normal.     Left Ear: External ear normal.     Nose: Nose normal.     Mouth/Throat:     Mouth: Mucous membranes are dry.  Eyes:     General:        Right eye: No discharge.        Left eye: No discharge.  Neck:     Musculoskeletal: Neck supple.  Cardiovascular:     Rate and Rhythm: Normal rate and regular rhythm.     Heart sounds: Normal heart sounds.  Pulmonary:     Effort: Pulmonary effort is normal.     Breath sounds: Normal breath sounds.  Abdominal:     Palpations: Abdomen is soft.     Tenderness: There is no abdominal tenderness.  Skin:    General: Skin is warm and dry.  Neurological:     Mental Status: He is alert.  Psychiatric:        Mood and Affect: Mood is not anxious.      ED Treatments / Results  Labs (all labs ordered are listed, but only abnormal results are displayed) Labs Reviewed  COMPREHENSIVE METABOLIC PANEL - Abnormal; Notable for the following components:      Result Value   Sodium 131 (*)    Chloride 91 (*)    Glucose, Bld 121 (*)    BUN 41 (*)    Creatinine, Ser 1.76 (*)    Calcium 7.6 (*)    Total Protein 6.1 (*)    Albumin 2.1 (*)    GFR calc non Af Amer 34 (*)    GFR calc Af Amer 39 (*)    All other components within normal limits  CBC - Abnormal; Notable for the following components:   WBC 16.0 (*)    RBC 3.67 (*)    Hemoglobin 10.8 (*)    HCT 33.0 (*)    All other components within normal limits  TROPONIN I (HIGH SENSITIVITY) - Abnormal; Notable for the following components:   Troponin I (High Sensitivity) 38 (*)    All other  components within normal limits  SARS CORONAVIRUS 2 (TAT 6-24 HRS)  LIPASE, BLOOD  URINALYSIS, ROUTINE W REFLEX MICROSCOPIC  COMPREHENSIVE METABOLIC PANEL  CBC    EKG None  Radiology Dg Abd Acute 2+v W 1v Chest  Result Date: 05/14/2019 CLINICAL DATA:  Vomiting. Constipation.  EXAM: DG ABDOMEN ACUTE W/ 1V CHEST COMPARISON:  Body CT May 11, 2019 FINDINGS: Nonobstructive bowel gas pattern. Centralization of the bowels due to moderate volume ascites. Normal amount of formed stool throughout the colon. Normal cardiac silhouette. Partially calcified pleural plaques noted. Calcific atherosclerotic disease of the aorta. IMPRESSION: 1. Nonobstructive bowel gas pattern. 2. Moderate volume ascites. Electronically Signed   By: Fidela Salisbury M.D.   On: 05/14/2019 19:31    Procedures Procedures (including critical care time)  Medications Ordered in ED Medications  0.9 %  sodium chloride infusion ( Intravenous New Bag/Given 05/14/19 2057)  enoxaparin (LOVENOX) injection 30 mg (has no administration in time range)  acetaminophen (TYLENOL) tablet 650 mg (has no administration in time range)    Or  acetaminophen (TYLENOL) suppository 650 mg (has no administration in time range)  zolpidem (AMBIEN) tablet 5 mg (has no administration in time range)  feeding supplement (PRO-STAT SUGAR FREE 64) liquid 30 mL (has no administration in time range)  sodium chloride 0.9 % bolus 1,000 mL (0 mLs Intravenous Stopped 05/14/19 2057)  ondansetron (ZOFRAN) injection 4 mg (4 mg Intravenous Given 05/14/19 1905)     Initial Impression / Assessment and Plan / ED Course  I have reviewed the triage vital signs and the nursing notes.  Pertinent labs & imaging results that were available during my care of the patient were reviewed by me and considered in my medical decision making (see chart for details).        Patient's vomiting appears to be from his likely new onset cancer.  Mild AKI/renal dysfunction from  vomiting.  Will give IV fluids.  Admit for observation and supportive care.  Dr. Maudie Mercury to admit.  Final Clinical Impressions(s) / ED Diagnoses   Final diagnoses:  Intractable vomiting with nausea, unspecified vomiting type  Dehydration    ED Discharge Orders    None       Sherwood Gambler, MD 05/14/19 2312

## 2019-05-14 NOTE — ED Notes (Signed)
Constipation x1 week

## 2019-05-15 ENCOUNTER — Other Ambulatory Visit: Payer: Self-pay | Admitting: Oncology

## 2019-05-15 ENCOUNTER — Other Ambulatory Visit: Payer: Self-pay

## 2019-05-15 DIAGNOSIS — E039 Hypothyroidism, unspecified: Secondary | ICD-10-CM | POA: Diagnosis present

## 2019-05-15 DIAGNOSIS — C786 Secondary malignant neoplasm of retroperitoneum and peritoneum: Secondary | ICD-10-CM | POA: Diagnosis present

## 2019-05-15 DIAGNOSIS — Z77090 Contact with and (suspected) exposure to asbestos: Secondary | ICD-10-CM | POA: Diagnosis present

## 2019-05-15 DIAGNOSIS — R180 Malignant ascites: Secondary | ICD-10-CM | POA: Diagnosis present

## 2019-05-15 DIAGNOSIS — I10 Essential (primary) hypertension: Secondary | ICD-10-CM | POA: Diagnosis not present

## 2019-05-15 DIAGNOSIS — Z20828 Contact with and (suspected) exposure to other viral communicable diseases: Secondary | ICD-10-CM | POA: Diagnosis present

## 2019-05-15 DIAGNOSIS — E785 Hyperlipidemia, unspecified: Secondary | ICD-10-CM | POA: Diagnosis present

## 2019-05-15 DIAGNOSIS — I129 Hypertensive chronic kidney disease with stage 1 through stage 4 chronic kidney disease, or unspecified chronic kidney disease: Secondary | ICD-10-CM | POA: Diagnosis present

## 2019-05-15 DIAGNOSIS — M1990 Unspecified osteoarthritis, unspecified site: Secondary | ICD-10-CM | POA: Diagnosis present

## 2019-05-15 DIAGNOSIS — R7989 Other specified abnormal findings of blood chemistry: Secondary | ICD-10-CM | POA: Diagnosis present

## 2019-05-15 DIAGNOSIS — H409 Unspecified glaucoma: Secondary | ICD-10-CM | POA: Diagnosis present

## 2019-05-15 DIAGNOSIS — R1310 Dysphagia, unspecified: Secondary | ICD-10-CM | POA: Diagnosis present

## 2019-05-15 DIAGNOSIS — R112 Nausea with vomiting, unspecified: Secondary | ICD-10-CM | POA: Diagnosis not present

## 2019-05-15 DIAGNOSIS — N179 Acute kidney failure, unspecified: Secondary | ICD-10-CM | POA: Diagnosis present

## 2019-05-15 DIAGNOSIS — Z85820 Personal history of malignant melanoma of skin: Secondary | ICD-10-CM | POA: Diagnosis not present

## 2019-05-15 DIAGNOSIS — D649 Anemia, unspecified: Secondary | ICD-10-CM | POA: Diagnosis not present

## 2019-05-15 DIAGNOSIS — I959 Hypotension, unspecified: Secondary | ICD-10-CM | POA: Diagnosis not present

## 2019-05-15 DIAGNOSIS — E871 Hypo-osmolality and hyponatremia: Secondary | ICD-10-CM | POA: Diagnosis present

## 2019-05-15 DIAGNOSIS — E43 Unspecified severe protein-calorie malnutrition: Secondary | ICD-10-CM | POA: Diagnosis present

## 2019-05-15 DIAGNOSIS — C459 Mesothelioma, unspecified: Secondary | ICD-10-CM | POA: Diagnosis present

## 2019-05-15 DIAGNOSIS — N183 Chronic kidney disease, stage 3 (moderate): Secondary | ICD-10-CM | POA: Diagnosis present

## 2019-05-15 DIAGNOSIS — Z87891 Personal history of nicotine dependence: Secondary | ICD-10-CM | POA: Diagnosis not present

## 2019-05-15 DIAGNOSIS — K228 Other specified diseases of esophagus: Secondary | ICD-10-CM | POA: Diagnosis not present

## 2019-05-15 DIAGNOSIS — D630 Anemia in neoplastic disease: Secondary | ICD-10-CM | POA: Diagnosis present

## 2019-05-15 DIAGNOSIS — C159 Malignant neoplasm of esophagus, unspecified: Secondary | ICD-10-CM | POA: Diagnosis present

## 2019-05-15 DIAGNOSIS — R972 Elevated prostate specific antigen [PSA]: Secondary | ICD-10-CM | POA: Diagnosis present

## 2019-05-15 DIAGNOSIS — E860 Dehydration: Secondary | ICD-10-CM | POA: Diagnosis present

## 2019-05-15 DIAGNOSIS — Z880 Allergy status to penicillin: Secondary | ICD-10-CM | POA: Diagnosis not present

## 2019-05-15 LAB — CORTISOL: Cortisol, Plasma: 20.8 ug/dL

## 2019-05-15 LAB — COMPREHENSIVE METABOLIC PANEL
ALT: 14 U/L (ref 0–44)
AST: 24 U/L (ref 15–41)
Albumin: 1.9 g/dL — ABNORMAL LOW (ref 3.5–5.0)
Alkaline Phosphatase: 69 U/L (ref 38–126)
Anion gap: 9 (ref 5–15)
BUN: 36 mg/dL — ABNORMAL HIGH (ref 8–23)
CO2: 28 mmol/L (ref 22–32)
Calcium: 7.4 mg/dL — ABNORMAL LOW (ref 8.9–10.3)
Chloride: 96 mmol/L — ABNORMAL LOW (ref 98–111)
Creatinine, Ser: 1.6 mg/dL — ABNORMAL HIGH (ref 0.61–1.24)
GFR calc Af Amer: 44 mL/min — ABNORMAL LOW (ref >60)
GFR calc non Af Amer: 38 mL/min — ABNORMAL LOW (ref >60)
Glucose, Bld: 85 mg/dL (ref 70–99)
Potassium: 4.6 mmol/L (ref 3.5–5.1)
Sodium: 133 mmol/L — ABNORMAL LOW (ref 135–145)
Total Bilirubin: 0.7 mg/dL (ref 0.3–1.2)
Total Protein: 5.2 g/dL — ABNORMAL LOW (ref 6.5–8.1)

## 2019-05-15 LAB — OSMOLALITY, URINE: Osmolality, Ur: 683 mOsm/kg (ref 300–900)

## 2019-05-15 LAB — D-DIMER, QUANTITATIVE: D-Dimer, Quant: 18.77 ug/mL-FEU — ABNORMAL HIGH (ref 0.00–0.50)

## 2019-05-15 LAB — CBC
HCT: 31.4 % — ABNORMAL LOW (ref 39.0–52.0)
Hemoglobin: 10.3 g/dL — ABNORMAL LOW (ref 13.0–17.0)
MCH: 30 pg (ref 26.0–34.0)
MCHC: 32.8 g/dL (ref 30.0–36.0)
MCV: 91.5 fL (ref 80.0–100.0)
Platelets: 358 10*3/uL (ref 150–400)
RBC: 3.43 MIL/uL — ABNORMAL LOW (ref 4.22–5.81)
RDW: 13.6 % (ref 11.5–15.5)
WBC: 13.9 10*3/uL — ABNORMAL HIGH (ref 4.0–10.5)
nRBC: 0 % (ref 0.0–0.2)

## 2019-05-15 LAB — URINALYSIS, ROUTINE W REFLEX MICROSCOPIC
Bilirubin Urine: NEGATIVE
Glucose, UA: NEGATIVE mg/dL
Hgb urine dipstick: NEGATIVE
Ketones, ur: NEGATIVE mg/dL
Leukocytes,Ua: NEGATIVE
Nitrite: NEGATIVE
Protein, ur: NEGATIVE mg/dL
Specific Gravity, Urine: 1.023 (ref 1.005–1.030)
pH: 5 (ref 5.0–8.0)

## 2019-05-15 LAB — OSMOLALITY: Osmolality: 286 mOsm/kg (ref 275–295)

## 2019-05-15 LAB — TROPONIN I (HIGH SENSITIVITY)
Troponin I (High Sensitivity): 36.5 ng/L — ABNORMAL HIGH (ref <18)
Troponin I (High Sensitivity): 39 ng/L — ABNORMAL HIGH (ref <18)

## 2019-05-15 LAB — TSH: TSH: 3.829 u[IU]/mL (ref 0.350–4.500)

## 2019-05-15 LAB — SODIUM, URINE, RANDOM: Sodium, Ur: <10 mmol/L

## 2019-05-15 LAB — CK TOTAL AND CKMB (NOT AT ARMC)
CK, MB: 1.5 ng/mL (ref 0.5–5.0)
Relative Index: INVALID (ref 0.0–2.5)
Total CK: 55 U/L (ref 49–397)

## 2019-05-15 MED ORDER — ONDANSETRON HCL 4 MG/2ML IJ SOLN
4.0000 mg | Freq: Four times a day (QID) | INTRAMUSCULAR | Status: DC | PRN
  Administered 2019-05-15 – 2019-05-16 (×2): 4 mg via INTRAVENOUS
  Filled 2019-05-15 (×2): qty 2

## 2019-05-15 MED ORDER — DEXTROSE-NACL 5-0.9 % IV SOLN
INTRAVENOUS | Status: DC
  Administered 2019-05-15 – 2019-05-18 (×5): via INTRAVENOUS

## 2019-05-15 MED ORDER — TRAZODONE HCL 50 MG PO TABS
25.0000 mg | ORAL_TABLET | Freq: Once | ORAL | Status: AC
  Administered 2019-05-15: 25 mg via ORAL
  Filled 2019-05-15: qty 1

## 2019-05-15 MED ORDER — DEXTROSE-NACL 5-0.9 % IV SOLN: INTRAVENOUS | Status: DC

## 2019-05-15 MED ORDER — BISACODYL 10 MG RE SUPP
10.0000 mg | Freq: Once | RECTAL | Status: AC
  Administered 2019-05-15: 10 mg via RECTAL
  Filled 2019-05-15: qty 1

## 2019-05-15 MED ORDER — ENOXAPARIN SODIUM 40 MG/0.4ML ~~LOC~~ SOLN
40.0000 mg | SUBCUTANEOUS | Status: DC
  Administered 2019-05-15: 40 mg via SUBCUTANEOUS
  Filled 2019-05-15: qty 0.4

## 2019-05-15 MED ORDER — PANTOPRAZOLE SODIUM 40 MG PO TBEC
40.0000 mg | DELAYED_RELEASE_TABLET | Freq: Two times a day (BID) | ORAL | Status: DC
  Administered 2019-05-15 – 2019-05-16 (×5): 40 mg via ORAL
  Filled 2019-05-15 (×6): qty 1

## 2019-05-15 MED ORDER — SODIUM CHLORIDE 0.9 % IV SOLN
INTRAVENOUS | Status: DC
  Administered 2019-05-15: 09:00:00 via INTRAVENOUS

## 2019-05-15 MED ORDER — LATANOPROST 0.005 % OP SOLN
1.0000 [drp] | Freq: Every day | OPHTHALMIC | Status: DC
  Administered 2019-05-15 – 2019-05-17 (×3): 1 [drp] via OPHTHALMIC
  Filled 2019-05-15 (×3): qty 2.5

## 2019-05-15 NOTE — Progress Notes (Signed)
Had medium bm in toilet.  Has had no vomiting since this morning.  Daughter at bedside today

## 2019-05-15 NOTE — Care Management Obs Status (Signed)
MEDICARE OBSERVATION STATUS NOTIFICATION   Patient Details  Name: Thomas [name] MRN: [id number] Date of Birth: 07-05-1930   Medicare Observation Status Notification Given:  Yes    [name] 05/15/2019, 2:24 PM

## 2019-05-15 NOTE — Progress Notes (Signed)
Patient Demographics:    Thomas [name], is a 85 y.o. male, DOB - [birth year], [id number]  Admit date - 05/14/2019   Admitting Physician Jani Gravel, MD  Outpatient Primary MD for the patient is Pickard, Cammie Mcgee, MD  LOS - 0   Chief Complaint  Patient presents with  . Emesis    for past week        Subjective:    Thomas [name] today has no fevers,    No chest pain,   --Nausea and emesis persists, daughter Arbie Cookey at bedside,  Assessment  & Plan :    Principal Problem:   ARF (acute renal failure) ([place]) Active Problems:   Essential hypertension   Hyponatremia   Mesothelioma ([place])   Protein-calorie malnutrition, severe ([facility])   Elevated troponin   Brief summary 85 y.o. male, w hypertension, hyperlipidemia, anemia, vitamin D deficiency,  elevated Psa (could not confirm , labs in Epic normal),  h/o melanoma, recent new diagnosis of mesothelioma w ascites emesis and abdominal discomfort  A/p 1) recurrent emesis--- abdominal imaging without obstructive findings, continue IV fluids, continue antiemetics, liquid diet and advance slowly  2) ascites--- query if mesothelioma related, previously scheduled to see Dr. Georga Hacking--- get diagnostic paracentesis for cytology, Gram stain and culture  3) elevated d-dimer--may be related to underlying malignancy/mesothelioma--- unable to do CTA chest for PE rule out due to elevated creatinine, get VQ scan in a.m. -- 4)HTN--atenolol 12.5 restarted  Disposition/Need for in-Hospital Stay- patient unable to be discharged at this time due to recurrent emesis and the patient was ascites awaiting diagnostic paracentesis--requiring IV fluids until oral intake is reasonable*  Code Status : Full  Family Communication:   NA (patient is alert, awake and coherent) -Discussed with daughter Arbie Cookey at bedside  Disposition Plan  : TBD  Consults  :   Oncology/radiology  DVT Prophylaxis  :  Lovenox - SCDs    Lab Results  Component Value Date   PLT 358 05/15/2019    Inpatient Medications  Scheduled Meds: . atenolol  12.5 mg Oral Daily  . enoxaparin (LOVENOX) injection  40 mg Subcutaneous Q24H  . feeding supplement (PRO-STAT SUGAR FREE 64)  30 mL Oral BID  . latanoprost  1 drop Both Eyes QHS  . pantoprazole  40 mg Oral BID   Continuous Infusions: . sodium chloride 150 mL/hr at 05/15/19 1249  . dextrose 5 % and 0.9% NaCl 75 mL/hr at 05/15/19 1741   PRN Meds:.acetaminophen **OR** acetaminophen, ondansetron (ZOFRAN) IV, zolpidem    Anti-infectives (From admission, onward)   None        Objective:   Vitals:   05/14/19 2130 05/14/19 2241 05/15/19 0554 05/15/19 1300  BP: 119/71 126/71 (!) 98/56 106/68  Pulse: 100 (!) 105 81 89  Resp:   15 16  Temp:  98.4 F (36.9 C) 98.2 F (36.8 C) 98.4 F (36.9 C)  TempSrc:  Oral Oral Oral  SpO2: 98% 98% 98% 98%  Weight:  79.2 kg    Height:  5\' 9"  (1.753 m)      Wt Readings from Last 3 Encounters:  05/14/19 79.2 kg  05/11/19 79.8 kg  05/09/19 79.8 kg     Intake/Output Summary (Last 24 hours) at 05/15/2019 1822 Last  data filed at 05/15/2019 1500 Gross per 24 hour  Intake 1081.61 ml  Output -  Net 1081.61 ml     Physical Exam  Gen:- Awake Alert,  In no apparent distress  HEENT:- [place].AT, No sclera icterus Neck-Supple Neck,No JVD,.  Lungs-  CTAB , fair symmetrical air movement CV- S1, S2 normal, regular  Abd-  +ve B.Sounds, Abd Soft, slightly distended, no significant tenderness Extremity/Skin:- No  edema, pedal pulses present  Psych-affect is appropriate, oriented x3 Neuro-generalized weakness, no new focal deficits, no tremors   Data Review:   Micro Results Recent Results (from the past 240 hour(s))  Novel Coronavirus, NAA ([facility])     Status: None   Collection Time: 05/09/19 10:14 AM   Specimen: Oropharyngeal(OP) collection in vial transport medium    OROPHARYNGEA  TESTING  Result Value Ref Range Status   SARS-CoV-2, NAA Not Detected Not Detected Final    Comment: This nucleic acid amplification test was developed and its perfomance characteristics determined by [employer]. Nucleic acid amplification tests include PCR and TMA. This test has not been FDA cleared or approved. This test has been authorized by FDA under an Emergency Use Authorization (EUA). This test is only authorized for the duration of time the declaration that circumstances exist justifying the authorization of the emergency use of in vitro diagnostic tests for detection of SARS-CoV-2 virus and/or diagnosis of COVID-19 infection under section 564(b)(1) of the Act, 21 U.S.C. [id number]) (1), unless the authorization is terminated or revoked sooner. When diagnostic testing is negative, the possibility of a false negative result should be considered in the context of a patient's recent exposures and the presence of clinical signs and symptoms consistent with COVID-19. An individual without symptoms of COVID-19 and who is not shedding SARS-CoV-2 virus would  expect to have a negative (not detected) result in this assay.     Radiology Reports Dg Chest 2 View  Result Date: 05/10/2019 CLINICAL DATA:  Cough. EXAM: CHEST - 2 VIEW COMPARISON:  Jan 09, 2014 FINDINGS: Cardiomediastinal silhouette is normal. Mediastinal contours appear intact. There is no evidence of focal airspace consolidation, pleural effusion or pneumothorax. Bilateral calcified pleural plaques are noted. Osseous structures are without acute abnormality. Soft tissues are grossly normal. IMPRESSION: 1. No active cardiopulmonary disease. 2. Bilateral calcified pleural plaques. Electronically Signed   By: Fidela Salisbury M.D.   On: 05/10/2019 20:13   Ct Chest W Contrast  Result Date: 05/11/2019 CLINICAL DATA:  Acute respiratory illness, productive cough wheezing and decreased fluid and fluid intake.  History of melanoma, hypotension, asbestos exposure EXAM: CT CHEST, ABDOMEN, AND PELVIS WITH CONTRAST TECHNIQUE: Multidetector CT imaging of the chest, abdomen and pelvis was performed following the standard protocol during bolus administration of intravenous contrast. CONTRAST:  111mL OMNIPAQUE IOHEXOL 300 MG/ML  SOLN COMPARISON:  CT chest March 01, 2008 FINDINGS: CT CHEST FINDINGS Cardiovascular: Atherosclerotic plaque within the normal caliber aorta. Shared origin of the brachiocephalic and common carotid artery. Calcifications noted in the proximal great vessels. Pulmonary arteries are normal caliber. Normal heart size. No pericardial effusion. Atherosclerotic calcification of the coronary arteries. Aortic leaflet calcifications are present. Mediastinum/Nodes: There is diffuse low attenuation, circumferential thickening of the thoracic esophagus with a small sliding-type hiatal hernia. 11 mm paraesophageal lymph node is present near the diaphragmatic hiatus (3/45). Thyroid gland and thoracic inlet are unremarkable. Trachea is free of acute abnormality. No other suspicious mediastinal, hilar or axillary lymph nodes. Lungs/Pleura: There are extensive scattered calcified pleural plaques which are similar  in extent to the study from 2009. No consolidation, features of edema, pneumothorax, or effusion. No suspicious pulmonary nodules or masses. Musculoskeletal: Exaggeration of the thoracic kyphosis with multilevel degenerative changes in the imaged portions of the spine. No suspicious osseous lesions are evident. CT ABDOMEN PELVIS FINDINGS Hepatobiliary: Diffuse hepatic hypoattenuation compatible with hepatic steatosis. No focal liver abnormality is seen. Calcified gallstones layering dependently within the gallbladder. No gallbladder wall thickening. No biliary ductal dilatation or calcified intraductal gallstones. Pancreas: Atrophic appearance of the pancreas. No ductal dilatation. Spleen: Normal in size without  focal abnormality. Adrenals/Urinary Tract: Normal adrenal glands. Atrophic appearance of the kidneys with mild bilateral symmetric perinephric stranding, a nonspecific finding though may correlate with either age or decreased renal function. Few tiny subcentimeter hypoattenuating foci too small to fully characterize on CT imaging but statistically likely benign. Kidneys are otherwise unremarkable, without renal calculi, suspicious lesion, or hydronephrosis. Circumferential bladder wall thickening though may in part be due to underdistention. Stomach/Bowel: Small sliding-type hiatal hernia, as above. Distal stomach and duodenum are unremarkable. Edematous mural thickening of the small bowel. Appendix is definitively identified. No colonic wall thickening or dilatation. Vascular/Lymphatic: Atherosclerotic plaque within the normal caliber aorta. Mild ostial narrowing of the renal arteries. No aneurysm or ectasia. No suspicious or enlarged lymph nodes in the included lymphatic chains. Reproductive: The prostate and seminal vesicles are unremarkable. Other: There is a moderate volume of low-attenuation (10 HU) intraperitoneal fluid with suspicious nodular, irregular peritoneal thickening most pronounced along the inferior and posterior extent of this collection. No free air. No bowel containing hernias. Musculoskeletal: Multilevel degenerative changes are present in the imaged portions of the spine. Straightening of the normal lumbar lordosis few large Schmorl's node formations are present most pronounced at L3. 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 No acute osseous abnormality or suspicious osseous lesion. Mild dextrocurvature of the spine. IMPRESSION: 1. New moderate volume of low-attenuation intraperitoneal fluid with suspicious nodular, irregular peritoneal thickening most pronounced along the inferior and posterior extent of this collection, given the patient's history of asbestos related  exposure in calcified pleural disease the overall appearance of this finding is suspicious for malignant peritoneal mesothelioma. Additional differential consideration includes peritoneal carcinomatosis with malignant ascites. 2. Diffuse low attenuation, circumferential thickening of the thoracic esophagus with small adjacent 11 mm paraesophageal lymph node near the diaphragmatic hiatus, findings could reflect an esophagitis though given above features malignancy should be excluded. Consider further evaluation with endoscopy. 3. Circumferential bladder wall thickening though may in part be due to underdistention. Correlate with urinalysis to exclude cystitis. 4. Cholelithiasis at evidence of acute cholecystitis. 5. Aortic Atherosclerosis (ICD10-I70.0). These results were called by telephone at the time of interpretation on 05/11/2019 at 5:50 am to Dr. Merrily Pew , who verbally acknowledged these results. Electronically Signed   By: Lovena Le M.D.   On: 05/11/2019 05:50   Ct Abdomen Pelvis W Contrast  Result Date: 05/11/2019 CLINICAL DATA:  Acute respiratory illness, productive cough wheezing and decreased fluid and fluid intake. History of melanoma, hypotension, asbestos exposure EXAM: CT CHEST, ABDOMEN, AND PELVIS WITH CONTRAST TECHNIQUE: Multidetector CT imaging of the chest, abdomen and pelvis was performed following the standard protocol during bolus administration of intravenous contrast. CONTRAST:  158mL OMNIPAQUE IOHEXOL 300 MG/ML  SOLN COMPARISON:  CT chest March 01, 2008 FINDINGS: CT CHEST FINDINGS Cardiovascular: Atherosclerotic plaque within the normal caliber aorta. Shared origin of the brachiocephalic and common carotid artery. Calcifications noted in the proximal great vessels. Pulmonary arteries  are normal caliber. Normal heart size. No pericardial effusion. Atherosclerotic calcification of the coronary arteries. Aortic leaflet calcifications are present. Mediastinum/Nodes: There is diffuse low  attenuation, circumferential thickening of the thoracic esophagus with a small sliding-type hiatal hernia. 11 mm paraesophageal lymph node is present near the diaphragmatic hiatus (3/45). Thyroid gland and thoracic inlet are unremarkable. Trachea is free of acute abnormality. No other suspicious mediastinal, hilar or axillary lymph nodes. Lungs/Pleura: There are extensive scattered calcified pleural plaques which are similar in extent to the study from 2009. No consolidation, features of edema, pneumothorax, or effusion. No suspicious pulmonary nodules or masses. Musculoskeletal: Exaggeration of the thoracic kyphosis with multilevel degenerative changes in the imaged portions of the spine. No suspicious osseous lesions are evident. CT ABDOMEN PELVIS FINDINGS Hepatobiliary: Diffuse hepatic hypoattenuation compatible with hepatic steatosis. No focal liver abnormality is seen. Calcified gallstones layering dependently within the gallbladder. No gallbladder wall thickening. No biliary ductal dilatation or calcified intraductal gallstones. Pancreas: Atrophic appearance of the pancreas. No ductal dilatation. Spleen: Normal in size without focal abnormality. Adrenals/Urinary Tract: Normal adrenal glands. Atrophic appearance of the kidneys with mild bilateral symmetric perinephric stranding, a nonspecific finding though may correlate with either age or decreased renal function. Few tiny subcentimeter hypoattenuating foci too small to fully characterize on CT imaging but statistically likely benign. Kidneys are otherwise unremarkable, without renal calculi, suspicious lesion, or hydronephrosis. Circumferential bladder wall thickening though may in part be due to underdistention. Stomach/Bowel: Small sliding-type hiatal hernia, as above. Distal stomach and duodenum are unremarkable. Edematous mural thickening of the small bowel. Appendix is definitively identified. No colonic wall thickening or dilatation. Vascular/Lymphatic:  Atherosclerotic plaque within the normal caliber aorta. Mild ostial narrowing of the renal arteries. No aneurysm or ectasia. No suspicious or enlarged lymph nodes in the included lymphatic chains. Reproductive: The prostate and seminal vesicles are unremarkable. Other: There is a moderate volume of low-attenuation (10 HU) intraperitoneal fluid with suspicious nodular, irregular peritoneal thickening most pronounced along the inferior and posterior extent of this collection. No free air. No bowel containing hernias. Musculoskeletal: Multilevel degenerative changes are present in the imaged portions of the spine. Straightening of the normal lumbar lordosis few large Schmorl's node formations are present most pronounced at L3. 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 No acute osseous abnormality or suspicious osseous lesion. Mild dextrocurvature of the spine. IMPRESSION: 1. New moderate volume of low-attenuation intraperitoneal fluid with suspicious nodular, irregular peritoneal thickening most pronounced along the inferior and posterior extent of this collection, given the patient's history of asbestos related exposure in calcified pleural disease the overall appearance of this finding is suspicious for malignant peritoneal mesothelioma. Additional differential consideration includes peritoneal carcinomatosis with malignant ascites. 2. Diffuse low attenuation, circumferential thickening of the thoracic esophagus with small adjacent 11 mm paraesophageal lymph node near the diaphragmatic hiatus, findings could reflect an esophagitis though given above features malignancy should be excluded. Consider further evaluation with endoscopy. 3. Circumferential bladder wall thickening though may in part be due to underdistention. Correlate with urinalysis to exclude cystitis. 4. Cholelithiasis at evidence of acute cholecystitis. 5. Aortic Atherosclerosis (ICD10-I70.0). These results were called by telephone at  the time of interpretation on 05/11/2019 at 5:50 am to Dr. Merrily Pew , who verbally acknowledged these results. Electronically Signed   By: Lovena Le M.D.   On: 05/11/2019 05:50   Dg Abd Acute 2+v W 1v Chest  Result Date: 05/14/2019 CLINICAL DATA:  Vomiting. Constipation. EXAM: DG ABDOMEN ACUTE W/ 1V  CHEST COMPARISON:  Body CT May 11, 2019 FINDINGS: Nonobstructive bowel gas pattern. Centralization of the bowels due to moderate volume ascites. Normal amount of formed stool throughout the colon. Normal cardiac silhouette. Partially calcified pleural plaques noted. Calcific atherosclerotic disease of the aorta. IMPRESSION: 1. Nonobstructive bowel gas pattern. 2. Moderate volume ascites. Electronically Signed   By: Fidela Salisbury M.D.   On: 05/14/2019 19:31     CBC Recent Labs  Lab 05/10/19 2002 05/14/19 1843 05/15/19 0414  WBC 15.2* 16.0* 13.9*  HGB 11.5* 10.8* 10.3*  HCT 34.3* 33.0* 31.4*  PLT 537* 391 358  MCV 88.6 89.9 91.5  MCH 29.7 29.4 30.0  MCHC 33.5 32.7 32.8  RDW 13.2 13.4 13.6    Chemistries  Recent Labs  Lab 05/10/19 2002 05/14/19 1843 05/15/19 0414  NA 129* 131* 133*  K 4.4 4.2 4.6  CL 88* 91* 96*  CO2 26 28 28   GLUCOSE 117* 121* 85  BUN 28* 41* 36*  CREATININE 1.38* 1.76* 1.60*  CALCIUM 8.6* 7.6* 7.4*  AST  --  28 24  ALT  --  17 14  ALKPHOS  --  79 69  BILITOT  --  0.3 0.7   ------------------------------------------------------------------------------------------------------------------ No results for input(s): CHOL, HDL, LDLCALC, TRIG, CHOLHDL, LDLDIRECT in the last 72 hours.  No results found for: HGBA1C ------------------------------------------------------------------------------------------------------------------ Recent Labs    05/15/19 0414  TSH 3.829   ------------------------------------------------------------------------------------------------------------------ No results for input(s): VITAMINB12, FOLATE, FERRITIN, TIBC, IRON,  RETICCTPCT in the last 72 hours.  Coagulation profile No results for input(s): INR, PROTIME in the last 168 hours.  Recent Labs    05/15/19 0012  DDIMER 18.77*    Cardiac Enzymes Recent Labs  Lab 05/15/19 0012  CKMB 1.5   ------------------------------------------------------------------------------------------------------------------ No results found for: BNP   Roxan Hockey M.D on 05/15/2019 at 6:22 PM  Go to [email or web address] - for contact info  [facility] - Office  [phone]

## 2019-05-15 NOTE — Progress Notes (Signed)
Had two more bms today

## 2019-05-15 NOTE — Progress Notes (Signed)
Vomited breakfast but was able to hold down a small amount of broth and other liquid for lunch.  Suppository given.  Daughter at bedside.

## 2019-05-16 ENCOUNTER — Inpatient Hospital Stay (HOSPITAL_COMMUNITY): Payer: Medicare Other

## 2019-05-16 ENCOUNTER — Encounter (HOSPITAL_COMMUNITY): Payer: Self-pay

## 2019-05-16 DIAGNOSIS — R112 Nausea with vomiting, unspecified: Secondary | ICD-10-CM

## 2019-05-16 DIAGNOSIS — K2289 Other specified disease of esophagus: Secondary | ICD-10-CM

## 2019-05-16 DIAGNOSIS — K228 Other specified diseases of esophagus: Secondary | ICD-10-CM

## 2019-05-16 DIAGNOSIS — N179 Acute kidney failure, unspecified: Principal | ICD-10-CM

## 2019-05-16 DIAGNOSIS — D649 Anemia, unspecified: Secondary | ICD-10-CM

## 2019-05-16 DIAGNOSIS — R180 Malignant ascites: Secondary | ICD-10-CM

## 2019-05-16 LAB — BASIC METABOLIC PANEL
Anion gap: 10 (ref 5–15)
BUN: 35 mg/dL — ABNORMAL HIGH (ref 8–23)
CO2: 25 mmol/L (ref 22–32)
Calcium: 7.2 mg/dL — ABNORMAL LOW (ref 8.9–10.3)
Chloride: 99 mmol/L (ref 98–111)
Creatinine, Ser: 1.56 mg/dL — ABNORMAL HIGH (ref 0.61–1.24)
GFR calc Af Amer: 46 mL/min — ABNORMAL LOW (ref >60)
GFR calc non Af Amer: 39 mL/min — ABNORMAL LOW (ref >60)
Glucose, Bld: 124 mg/dL — ABNORMAL HIGH (ref 70–99)
Potassium: 3.7 mmol/L (ref 3.5–5.1)
Sodium: 134 mmol/L — ABNORMAL LOW (ref 135–145)

## 2019-05-16 LAB — BODY FLUID CELL COUNT WITH DIFFERENTIAL
Eos, Fluid: 0 %
Lymphs, Fluid: 90 %
Monocyte-Macrophage-Serous Fluid: 5 % — ABNORMAL LOW (ref 50–90)
Neutrophil Count, Fluid: 5 % (ref 0–25)
Total Nucleated Cell Count, Fluid: 2137 cu mm — ABNORMAL HIGH (ref 0–1000)

## 2019-05-16 LAB — GRAM STAIN

## 2019-05-16 LAB — CBC
HCT: 31.6 % — ABNORMAL LOW (ref 39.0–52.0)
Hemoglobin: 10.2 g/dL — ABNORMAL LOW (ref 13.0–17.0)
MCH: 29.5 pg (ref 26.0–34.0)
MCHC: 32.3 g/dL (ref 30.0–36.0)
MCV: 91.3 fL (ref 80.0–100.0)
Platelets: 334 10*3/uL (ref 150–400)
RBC: 3.46 MIL/uL — ABNORMAL LOW (ref 4.22–5.81)
RDW: 13.7 % (ref 11.5–15.5)
WBC: 14.7 10*3/uL — ABNORMAL HIGH (ref 4.0–10.5)
nRBC: 0 % (ref 0.0–0.2)

## 2019-05-16 LAB — SARS CORONAVIRUS 2 (TAT 6-24 HRS): SARS Coronavirus 2: NEGATIVE

## 2019-05-16 LAB — LACTATE DEHYDROGENASE, PLEURAL OR PERITONEAL FLUID: LD, Fluid: 1633 U/L — ABNORMAL HIGH (ref 3–23)

## 2019-05-16 LAB — PROTEIN, PLEURAL OR PERITONEAL FLUID: Total protein, fluid: 3.1 g/dL

## 2019-05-16 LAB — AMYLASE, PLEURAL OR PERITONEAL FLUID: Amylase, Fluid: 15 U/L

## 2019-05-16 MED ORDER — TECHNETIUM TO 99M ALBUMIN AGGREGATED
1.5000 | Freq: Once | INTRAVENOUS | Status: AC | PRN
  Administered 2019-05-16: 1.9 via INTRAVENOUS

## 2019-05-16 MED ORDER — TECHNETIUM TC 99M DIETHYLENETRIAME-PENTAACETIC ACID
30.0000 | Freq: Once | INTRAVENOUS | Status: AC | PRN
  Administered 2019-05-16: 30 via RESPIRATORY_TRACT

## 2019-05-16 MED ORDER — ONDANSETRON HCL 4 MG/2ML IJ SOLN
4.0000 mg | Freq: Three times a day (TID) | INTRAMUSCULAR | Status: DC
  Administered 2019-05-16 – 2019-05-18 (×2): 4 mg via INTRAVENOUS
  Filled 2019-05-16 (×3): qty 2

## 2019-05-16 MED ORDER — ENOXAPARIN SODIUM 40 MG/0.4ML ~~LOC~~ SOLN: 40.0000 mg | SUBCUTANEOUS | Status: DC

## 2019-05-16 NOTE — Progress Notes (Signed)
Patient Demographics:    Thomas [name], is a 85 y.o. male, DOB - 04/07/1930, [id number]  Admit date - 05/14/2019   Admitting Physician Jani Gravel, MD  Outpatient Primary MD for the patient is Susy Frizzle, MD  LOS - 1   Chief Complaint  Patient presents with   Emesis    for past week        Subjective:    Thomas [name] today has no fevers,    No chest pain,   -Resting after paracentesis, daughter Arbie Cookey at bedside,  Assessment  & Plan :    Principal Problem:   ARF (acute renal failure) ([place]) Active Problems:   Essential hypertension   Hyponatremia   Mesothelioma ([place])   Protein-calorie malnutrition, severe ([place])   Elevated troponin   Esophageal thickening   Nausea and vomiting   Malignant ascites   Normocytic anemia   Brief summary 85 y.o. male, w hypertension, hyperlipidemia, anemia, vitamin D deficiency,  elevated Psa (could not confirm , labs in Epic normal),  h/o melanoma, recent new diagnosis of mesothelioma w ascites emesis and abdominal discomfort  A/p 1)Recurrent emesis--- abdominal imaging without obstructive findings, continue IV fluids, continue antiemetics, liquid diet and advance slowly -For EGD on 05/17/2019  2)Ascites--- query if mesothelioma related, previously scheduled to see Dr. Georga Hacking--- get diagnostic paracentesis for cytology, Gram stain and culture -Paracentesis on 05/16/2019 with 4.2 L of serosanguineous fluid removed. - Cytology is pending at this time.  - CEA level pending. -  Awaiting EGD and possible biopsy of suspicious lesion at the esophageal thickening. - Would also recommend biopsy of the peritoneal nodules if the cytology is inconclusive. -Peritoneal fluid cell count is > 2100.Marland Kitchen Suspect inflammatory (/reactive) rather than [name] -Peritoneal fluid LDH is 1633  3)Elevated d-dimer--may be related to underlying  malignancy/mesothelioma--- unable to do CTA chest for PE rule out due to elevated creatinine,   --VQ scan low probability for PE   4)HTN--hold atenolol 12.5 due to soft BP  5)AKI----acute kidney injury on CKD stage - III     creatinine on admission= 1.76  ,   baseline creatinine =  1.2 to 1.3   , creatinine is now= 1.56   , renally adjust medications, avoid nephrotoxic agents/dehydration/hypotension  Disposition/Need for in-Hospital Stay- patient unable to be discharged at this time due to recurrent emesis and the patient was ascites awaiting diagnostic paracentesis--requiring IV fluids until oral intake is reasonable*  Code Status : Full  Family Communication:   NA (patient is alert, awake and coherent) -Discussed with daughter Arbie Cookey at bedside  Disposition Plan  : TBD  Consults  :  Oncology/radiology  DVT Prophylaxis  :  Lovenox - SCDs    Lab Results  Component Value Date   PLT 334 05/16/2019    Inpatient Medications  Scheduled Meds:  atenolol  12.5 mg Oral Daily   enoxaparin (LOVENOX) injection  40 mg Subcutaneous Q24H   feeding supplement (PRO-STAT SUGAR FREE 64)  30 mL Oral BID   latanoprost  1 drop Both Eyes QHS   ondansetron (ZOFRAN) IV  4 mg Intravenous TID WC   pantoprazole  40 mg Oral BID   Continuous Infusions:  dextrose 5 % and 0.9% NaCl 75 mL/hr at  05/16/19 0702   PRN Meds:.acetaminophen **OR** acetaminophen, ondansetron (ZOFRAN) IV    Anti-infectives (From admission, onward)   None        Objective:   Vitals:   05/16/19 1144 05/16/19 1219 05/16/19 1332 05/16/19 1556  BP: 98/60 (!) 98/57 (!) 86/46 94/60  Pulse: 88 86 86   Resp: 18 16 18    Temp:   (!) 97.2 F (36.2 C)   TempSrc:      SpO2: 95% 100% 99%   Weight:      Height:        Wt Readings from Last 3 Encounters:  05/16/19 81.8 kg  05/11/19 79.8 kg  05/09/19 79.8 kg     Intake/Output Summary (Last 24 hours) at 05/16/2019 1823 Last data filed at 05/16/2019 1649 Gross per 24  hour  Intake 1230 ml  Output 2 ml  Net 1228 ml     Physical Exam  Gen:- Awake Alert,  In no apparent distress  HEENT:- [place].AT, No sclera icterus Neck-Supple Neck,No JVD,.  Lungs-  CTAB , fair symmetrical air movement CV- S1, S2 normal, regular  Abd-  +ve B.Sounds, Abd Soft, much less distended after paracentesis, no significant tenderness Extremity/Skin:- No  edema, pedal pulses present  Psych-affect is appropriate, oriented x3 Neuro-generalized weakness, no new focal deficits, no tremors   Data Review:   Micro Results Recent Results (from the past 240 hour(s))  Novel Coronavirus, NAA ([facility])     Status: None   Collection Time: 05/09/19 10:14 AM   Specimen: Oropharyngeal(OP) collection in vial transport medium   OROPHARYNGEA  TESTING  Result Value Ref Range Status   SARS-CoV-2, NAA Not Detected Not Detected Final    Comment: This nucleic acid amplification test was developed and its perfomance characteristics determined by [employer]. Nucleic acid amplification tests include PCR and TMA. This test has not been FDA cleared or approved. This test has been authorized by FDA under an Emergency Use Authorization (EUA). This test is only authorized for the duration of time the declaration that circumstances exist justifying the authorization of the emergency use of in vitro diagnostic tests for detection of SARS-CoV-2 virus and/or diagnosis of COVID-19 infection under section 564(b)(1) of the Act, 21 U.S.C. [id number]) (1), unless the authorization is terminated or revoked sooner. When diagnostic testing is negative, the possibility of a false negative result should be considered in the context of a patient's recent exposures and the presence of clinical signs and symptoms consistent with COVID-19. An individual without symptoms of COVID-19 and who is not shedding SARS-CoV-2 virus would  expect to have a negative (not detected) result in this assay.   SARS  CORONAVIRUS 2 (TAT 6-24 HRS) Nasopharyngeal Nasopharyngeal Swab     Status: None   Collection Time: 05/14/19  8:52 PM   Specimen: Nasopharyngeal Swab  Result Value Ref Range Status   SARS Coronavirus 2 NEGATIVE NEGATIVE Final    Comment: (NOTE) SARS-CoV-2 target nucleic acids are NOT DETECTED. The SARS-CoV-2 RNA is generally detectable in upper and lower respiratory specimens during the acute phase of infection. Negative results do not preclude SARS-CoV-2 infection, do not rule out co-infections with other pathogens, and should not be used as the sole basis for treatment or other patient management decisions. Negative results must be combined with clinical observations, patient history, and epidemiological information. The expected result is Negative. Fact Sheet for Patients: SugarRoll.be Fact Sheet for Healthcare Providers: https://www.woods-mathews.com/ This test is not yet approved or cleared by the [place] FDA  and  has been authorized for detection and/or diagnosis of SARS-CoV-2 by FDA under an Emergency Use Authorization (EUA). This EUA will remain  in effect (meaning this test can be used) for the duration of the COVID-19 declaration under Section 56 4(b)(1) of the Act, 21 U.S.C. section 360bbb-3(b)(1), unless the authorization is terminated or revoked sooner. Performed at [facility], [employer] [address], [place], [place] [address]   Gram stain     Status: None   Collection Time: 05/16/19 11:25 AM   Specimen: Ascitic  Result Value Ref Range Status   Specimen Description ASCITIC  Final   Special Requests NONE  Final   Gram Stain   Final    CYTOSPIN SMEAR ASCITIC FLUID WBC PRESENT, PREDOMINANTLY MONONUCLEAR NO ORGANISMS SEEN [facility] Performed at [facility], [address], [place], [place] [address]    Report Status 05/16/2019 FINAL  Final    Radiology Reports Dg Chest 2 View  Result Date:  05/16/2019 CLINICAL DATA:  85 year old male with shortness of breath. EXAM: CHEST - 2 VIEW COMPARISON:  CT Chest, Abdomen, and Pelvis 05/11/2019, and earlier. FINDINGS: Calcified pleural plaques in both lungs. Mediastinal contours remain normal. Visualized tracheal air column is within normal limits. No pneumothorax, pulmonary edema, pleural effusion or acute pulmonary opacity. Calcified aortic atherosclerosis. No acute osseous abnormality identified. Paucity of bowel gas in the upper abdomen. IMPRESSION: Stable calcified pleural plaques. No acute cardiopulmonary abnormality. Electronically Signed   By: Genevie Ann M.D.   On: 05/16/2019 10:11   Dg Chest 2 View  Result Date: 05/10/2019 CLINICAL DATA:  Cough. EXAM: CHEST - 2 VIEW COMPARISON:  Jan 09, 2014 FINDINGS: Cardiomediastinal silhouette is normal. Mediastinal contours appear intact. There is no evidence of focal airspace consolidation, pleural effusion or pneumothorax. Bilateral calcified pleural plaques are noted. Osseous structures are without acute abnormality. Soft tissues are grossly normal. IMPRESSION: 1. No active cardiopulmonary disease. 2. Bilateral calcified pleural plaques. Electronically Signed   By: Fidela Salisbury M.D.   On: 05/10/2019 20:13   Ct Chest W Contrast  Result Date: 05/11/2019 CLINICAL DATA:  Acute respiratory illness, productive cough wheezing and decreased fluid and fluid intake. History of melanoma, hypotension, asbestos exposure EXAM: CT CHEST, ABDOMEN, AND PELVIS WITH CONTRAST TECHNIQUE: Multidetector CT imaging of the chest, abdomen and pelvis was performed following the standard protocol during bolus administration of intravenous contrast. CONTRAST:  185mL OMNIPAQUE IOHEXOL 300 MG/ML  SOLN COMPARISON:  CT chest March 01, 2008 FINDINGS: CT CHEST FINDINGS Cardiovascular: Atherosclerotic plaque within the normal caliber aorta. Shared origin of the brachiocephalic and common carotid artery. Calcifications noted in the proximal  great vessels. Pulmonary arteries are normal caliber. Normal heart size. No pericardial effusion. Atherosclerotic calcification of the coronary arteries. Aortic leaflet calcifications are present. Mediastinum/Nodes: There is diffuse low attenuation, circumferential thickening of the thoracic esophagus with a small sliding-type hiatal hernia. 11 mm paraesophageal lymph node is present near the diaphragmatic hiatus (3/45). Thyroid gland and thoracic inlet are unremarkable. Trachea is free of acute abnormality. No other suspicious mediastinal, hilar or axillary lymph nodes. Lungs/Pleura: There are extensive scattered calcified pleural plaques which are similar in extent to the study from 2009. No consolidation, features of edema, pneumothorax, or effusion. No suspicious pulmonary nodules or masses. Musculoskeletal: Exaggeration of the thoracic kyphosis with multilevel degenerative changes in the imaged portions of the spine. No suspicious osseous lesions are evident. CT ABDOMEN PELVIS FINDINGS Hepatobiliary: Diffuse hepatic hypoattenuation compatible with hepatic steatosis. No focal liver abnormality  is seen. Calcified gallstones layering dependently within the gallbladder. No gallbladder wall thickening. No biliary ductal dilatation or calcified intraductal gallstones. Pancreas: Atrophic appearance of the pancreas. No ductal dilatation. Spleen: Normal in size without focal abnormality. Adrenals/Urinary Tract: Normal adrenal glands. Atrophic appearance of the kidneys with mild bilateral symmetric perinephric stranding, a nonspecific finding though may correlate with either age or decreased renal function. Few tiny subcentimeter hypoattenuating foci too small to fully characterize on CT imaging but statistically likely benign. Kidneys are otherwise unremarkable, without renal calculi, suspicious lesion, or hydronephrosis. Circumferential bladder wall thickening though may in part be due to underdistention.  Stomach/Bowel: Small sliding-type hiatal hernia, as above. Distal stomach and duodenum are unremarkable. Edematous mural thickening of the small bowel. Appendix is definitively identified. No colonic wall thickening or dilatation. Vascular/Lymphatic: Atherosclerotic plaque within the normal caliber aorta. Mild ostial narrowing of the renal arteries. No aneurysm or ectasia. No suspicious or enlarged lymph nodes in the included lymphatic chains. Reproductive: The prostate and seminal vesicles are unremarkable. Other: There is a moderate volume of low-attenuation (10 HU) intraperitoneal fluid with suspicious nodular, irregular peritoneal thickening most pronounced along the inferior and posterior extent of this collection. No free air. No bowel containing hernias. Musculoskeletal: Multilevel degenerative changes are present in the imaged portions of the spine. Straightening of the normal lumbar lordosis few large Schmorl's node formations are present most pronounced at L3. 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 No acute osseous abnormality or suspicious osseous lesion. Mild dextrocurvature of the spine. IMPRESSION: 1. New moderate volume of low-attenuation intraperitoneal fluid with suspicious nodular, irregular peritoneal thickening most pronounced along the inferior and posterior extent of this collection, given the patient's history of asbestos related exposure in calcified pleural disease the overall appearance of this finding is suspicious for malignant peritoneal mesothelioma. Additional differential consideration includes peritoneal carcinomatosis with malignant ascites. 2. Diffuse low attenuation, circumferential thickening of the thoracic esophagus with small adjacent 11 mm paraesophageal lymph node near the diaphragmatic hiatus, findings could reflect an esophagitis though given above features malignancy should be excluded. Consider further evaluation with endoscopy. 3. Circumferential  bladder wall thickening though may in part be due to underdistention. Correlate with urinalysis to exclude cystitis. 4. Cholelithiasis at evidence of acute cholecystitis. 5. Aortic Atherosclerosis (ICD10-I70.0). These results were called by telephone at the time of interpretation on 05/11/2019 at 5:50 am to Dr. Merrily Pew , who verbally acknowledged these results. Electronically Signed   By: Lovena Le M.D.   On: 05/11/2019 05:50   Ct Abdomen Pelvis W Contrast  Result Date: 05/11/2019 CLINICAL DATA:  Acute respiratory illness, productive cough wheezing and decreased fluid and fluid intake. History of melanoma, hypotension, asbestos exposure EXAM: CT CHEST, ABDOMEN, AND PELVIS WITH CONTRAST TECHNIQUE: Multidetector CT imaging of the chest, abdomen and pelvis was performed following the standard protocol during bolus administration of intravenous contrast. CONTRAST:  164mL OMNIPAQUE IOHEXOL 300 MG/ML  SOLN COMPARISON:  CT chest March 01, 2008 FINDINGS: CT CHEST FINDINGS Cardiovascular: Atherosclerotic plaque within the normal caliber aorta. Shared origin of the brachiocephalic and common carotid artery. Calcifications noted in the proximal great vessels. Pulmonary arteries are normal caliber. Normal heart size. No pericardial effusion. Atherosclerotic calcification of the coronary arteries. Aortic leaflet calcifications are present. Mediastinum/Nodes: There is diffuse low attenuation, circumferential thickening of the thoracic esophagus with a small sliding-type hiatal hernia. 11 mm paraesophageal lymph node is present near the diaphragmatic hiatus (3/45). Thyroid gland and thoracic inlet are unremarkable. Trachea is free  of acute abnormality. No other suspicious mediastinal, hilar or axillary lymph nodes. Lungs/Pleura: There are extensive scattered calcified pleural plaques which are similar in extent to the study from 2009. No consolidation, features of edema, pneumothorax, or effusion. No suspicious pulmonary  nodules or masses. Musculoskeletal: Exaggeration of the thoracic kyphosis with multilevel degenerative changes in the imaged portions of the spine. No suspicious osseous lesions are evident. CT ABDOMEN PELVIS FINDINGS Hepatobiliary: Diffuse hepatic hypoattenuation compatible with hepatic steatosis. No focal liver abnormality is seen. Calcified gallstones layering dependently within the gallbladder. No gallbladder wall thickening. No biliary ductal dilatation or calcified intraductal gallstones. Pancreas: Atrophic appearance of the pancreas. No ductal dilatation. Spleen: Normal in size without focal abnormality. Adrenals/Urinary Tract: Normal adrenal glands. Atrophic appearance of the kidneys with mild bilateral symmetric perinephric stranding, a nonspecific finding though may correlate with either age or decreased renal function. Few tiny subcentimeter hypoattenuating foci too small to fully characterize on CT imaging but statistically likely benign. Kidneys are otherwise unremarkable, without renal calculi, suspicious lesion, or hydronephrosis. Circumferential bladder wall thickening though may in part be due to underdistention. Stomach/Bowel: Small sliding-type hiatal hernia, as above. Distal stomach and duodenum are unremarkable. Edematous mural thickening of the small bowel. Appendix is definitively identified. No colonic wall thickening or dilatation. Vascular/Lymphatic: Atherosclerotic plaque within the normal caliber aorta. Mild ostial narrowing of the renal arteries. No aneurysm or ectasia. No suspicious or enlarged lymph nodes in the included lymphatic chains. Reproductive: The prostate and seminal vesicles are unremarkable. Other: There is a moderate volume of low-attenuation (10 HU) intraperitoneal fluid with suspicious nodular, irregular peritoneal thickening most pronounced along the inferior and posterior extent of this collection. No free air. No bowel containing hernias. Musculoskeletal: Multilevel  degenerative changes are present in the imaged portions of the spine. Straightening of the normal lumbar lordosis few large Schmorl's node formations are present most pronounced at L3. 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 No acute osseous abnormality or suspicious osseous lesion. Mild dextrocurvature of the spine. IMPRESSION: 1. New moderate volume of low-attenuation intraperitoneal fluid with suspicious nodular, irregular peritoneal thickening most pronounced along the inferior and posterior extent of this collection, given the patient's history of asbestos related exposure in calcified pleural disease the overall appearance of this finding is suspicious for malignant peritoneal mesothelioma. Additional differential consideration includes peritoneal carcinomatosis with malignant ascites. 2. Diffuse low attenuation, circumferential thickening of the thoracic esophagus with small adjacent 11 mm paraesophageal lymph node near the diaphragmatic hiatus, findings could reflect an esophagitis though given above features malignancy should be excluded. Consider further evaluation with endoscopy. 3. Circumferential bladder wall thickening though may in part be due to underdistention. Correlate with urinalysis to exclude cystitis. 4. Cholelithiasis at evidence of acute cholecystitis. 5. Aortic Atherosclerosis (ICD10-I70.0). These results were called by telephone at the time of interpretation on 05/11/2019 at 5:50 am to Dr. Merrily Pew , who verbally acknowledged these results. Electronically Signed   By: Lovena Le M.D.   On: 05/11/2019 05:50   Nm Pulmonary Perf And Vent  Result Date: 05/16/2019 CLINICAL DATA:  Shortness of breath, worsening effort tolerance, dyspnea, question peritoneal carcinomatosis by CT, elevated D-dimer question pulmonary embolism EXAM: NUCLEAR MEDICINE VENTILATION - PERFUSION LUNG SCAN TECHNIQUE: Ventilation images were obtained in multiple projections using inhaled aerosol  Tc-54m DTPA. Perfusion images were obtained in multiple projections after intravenous injection of Tc-28m MAA. RADIOPHARMACEUTICALS:  30 mCi of Tc-27m DTPA aerosol inhalation and 1.9 mCi Tc90m MAA IV COMPARISON:  None Correlation:  Chest radiograph 05/16/2019 FINDINGS: Ventilation: Diminished ventilation in RIGHT upper lobe. Central airway deposition of aerosol. No additional ventilation defects. Perfusion: Absent perfusion RIGHT upper lobe. Additional small subsegmental perfusion defect lingula which correlates to a calcified pleural plaque on CT. No additional perfusion defects. Chest radiograph: Calcified pleural plaque disease bilaterally. IMPRESSION: Small subsegmental perfusion defect in lingula corresponds to a calcified pleural plaque on CT. Absent perfusion RIGHT upper lobe with diminished though not absent ventilation in RIGHT upper lobe, ventilation better than perfusion. Solitary lobar mismatch represents a low probability for pulmonary embolism by PIOPED II criteria. Overall findings represent a low probability for pulmonary embolism. Electronically Signed   By: Lavonia Dana M.D.   On: 05/16/2019 11:44   US Paracentesis  Result Date: 05/16/2019 INDICATION: Ascites EXAM: ULTRASOUND GUIDED DIAGNOSTIC AND THERAPEUTIC PARACENTESIS MEDICATIONS: None. COMPLICATIONS: None immediate. PROCEDURE: Procedure, benefits, and risks of procedure were discussed with patient. Written informed consent for procedure was obtained. Time out protocol followed. Adequate collection of ascites localized by ultrasound in RIGHT lower quadrant. Skin prepped and draped in usual sterile fashion. Skin and soft tissues anesthetized with 10 mL of 1% lidocaine. 5 [place] Yueh catheter placed into peritoneal cavity. 4.2 L of serosanguineous ascitic fluid aspirated by vacuum bottle suction. Procedure tolerated well by patient without immediate complication. FINDINGS: A total of approximately 4.2 L of ascitic fluid was removed. Samples were  sent to the laboratory as requested by the clinical team. IMPRESSION: Successful ultrasound-guided paracentesis yielding 4.2 liters of peritoneal fluid. Electronically Signed   By: Lavonia Dana M.D.   On: 05/16/2019 12:01   Dg Abd Acute 2+v W 1v Chest  Result Date: 05/14/2019 CLINICAL DATA:  Vomiting. Constipation. EXAM: DG ABDOMEN ACUTE W/ 1V CHEST COMPARISON:  Body CT May 11, 2019 FINDINGS: Nonobstructive bowel gas pattern. Centralization of the bowels due to moderate volume ascites. Normal amount of formed stool throughout the colon. Normal cardiac silhouette. Partially calcified pleural plaques noted. Calcific atherosclerotic disease of the aorta. IMPRESSION: 1. Nonobstructive bowel gas pattern. 2. Moderate volume ascites. Electronically Signed   By: Fidela Salisbury M.D.   On: 05/14/2019 19:31     CBC Recent Labs  Lab 05/10/19 2002 05/14/19 1843 05/15/19 0414 05/16/19 1314  WBC 15.2* 16.0* 13.9* 14.7*  HGB 11.5* 10.8* 10.3* 10.2*  HCT 34.3* 33.0* 31.4* 31.6*  PLT 537* 391 358 334  MCV 88.6 89.9 91.5 91.3  MCH 29.7 29.4 30.0 29.5  MCHC 33.5 32.7 32.8 32.3  RDW 13.2 13.4 13.6 13.7    Chemistries  Recent Labs  Lab 05/10/19 2002 05/14/19 1843 05/15/19 0414 05/16/19 1314  NA 129* 131* 133* 134*  K 4.4 4.2 4.6 3.7  CL 88* 91* 96* 99  CO2 26 28 28 25   GLUCOSE 117* 121* 85 124*  BUN 28* 41* 36* 35*  CREATININE 1.38* 1.76* 1.60* 1.56*  CALCIUM 8.6* 7.6* 7.4* 7.2*  AST  --  28 24  --   ALT  --  17 14  --   ALKPHOS  --  79 69  --   BILITOT  --  0.3 0.7  --    ------------------------------------------------------------------------------------------------------------------ No results for input(s): CHOL, HDL, LDLCALC, TRIG, CHOLHDL, LDLDIRECT in the last 72 hours.  No results found for: HGBA1C ------------------------------------------------------------------------------------------------------------------ Recent Labs    05/15/19 0414  TSH 3.829    ------------------------------------------------------------------------------------------------------------------ No results for input(s): VITAMINB12, FOLATE, FERRITIN, TIBC, IRON, RETICCTPCT in the last 72 hours.  Coagulation profile No results for input(s): INR, PROTIME in the  last 168 hours.  Recent Labs    05/15/19 0012  DDIMER 18.77*    Cardiac Enzymes Recent Labs  Lab 05/15/19 0012  CKMB 1.5   ------------------------------------------------------------------------------------------------------------------ No results found for: BNP   Roxan Hockey M.D on 05/16/2019 at 6:23 PM  Go to [email or web address] - for contact info  [facility] - Office  [phone]

## 2019-05-16 NOTE — Progress Notes (Signed)
Paracentesis complete no signs of distress.  

## 2019-05-16 NOTE — Progress Notes (Signed)
Has had no vomiting since this morning after eating a few bites of breakfast.  Did eat about 10 bites of solid food for lunch  Reports no bms today.  Knows to be NPO after midnight.  Daughter at bedside.

## 2019-05-16 NOTE — Progress Notes (Signed)
Stated vomited after only a few bites of breakfast.  Returned from paracentesis with 4.2 L removed.  Abd soft, bandaid intact with no drainage.  Attempting to eat some lunch. Daughter at bedside.

## 2019-05-16 NOTE — Consult Note (Signed)
[facility] Consultation Oncology  Name: Thomas [name]      MRN: [id number]    Location: A303/A303-01  Date: 05/16/2019 Time:4:50 PM   REFERRING PHYSICIAN: Dr. Joesph Fillers  REASON FOR CONSULT: To evaluate for malignant peritoneal mesothelioma.   DIAGNOSIS: Ascites with irregular nodular peritoneal thickening  HISTORY OF PRESENT ILLNESS: Mr. Thomas [name] is a 85 year old very pleasant white male who is seen in consultation today for further work-up and management of possible peritoneal carcinomatosis versus malignant peritoneal mesothelioma.  He came into the hospital with several weeks duration of weakness, occasional nausea and vomiting.  He thought he may have lost 2 to 3 pounds of weight.  A CT CAP on 05/11/2019 showed new moderate volume ascites with suspicious nodular, irregular peritoneal thickening most pronounced along the inferior and posterior extent of fluid collection suspicious for malignant peritoneal mesothelioma.  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  Also noted on the CAT scan was circumferential thickening of the thoracic esophagus with small adjacent 11 mm paraesophageal lymph node near the diaphragmatic hiatus.  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  Since then he was monitored until 2009 when the calcified pleural disease has been stable.  He is slightly vague in terms of his symptoms including difficulty swallowing.  He reported there is occasional regurgitation after eating.  He denies any abdominal pain.  D-dimer was found to be elevated.  VQ scan done today had low probability of pulmonary embolism.  He also had abdominal paracentesis done with 4.2 L of fluid removed.  He lives at home with his wife.  He quit smoking 50 years ago and he smoked 3 years, 2 packs/day.  Sister had cancer, type unknown to the patient.  Brother had lung cancer.  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  PAST MEDICAL HISTORY:   Past Medical History:  Diagnosis Date  . ANEMIA 02/08/2008  . ARTHRITIS 02/08/2008  . Cataract    left eye  . 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 ELEVATED PROSTATE SPECIFIC ANTIGEN 02/08/2008  . GLAUCOMA, LEFT EYE 02/07/2009   left eye  . HISTORY OF ASBESTOS EXPOSURE 02/08/2008  . HYPERLIPIDEMIA 02/08/2008  . Hypertension   . HYPOTENSION 02/18/2010  . HYPOTHYROIDISM 02/08/2008  . Melanoma ([place])    left shoulder  . Mesothelioma ([place]) 05/11/2019   on CT  . VITAMIN D DEFICIENCY 02/07/2009    ALLERGIES: Allergies  Allergen Reactions  . Penicillins Rash    REACTION: rash      MEDICATIONS: I have reviewed the patient's current medications.     PAST SURGICAL HISTORY Past Surgical History:  Procedure Laterality Date  . dupyreenes contractures-bilat    . SKIN CANCER EXCISION      FAMILY HISTORY: Family History  Problem Relation Age of Onset  . Hypertension Mother   . Lung cancer Brother   . Heart disease Brother   . Cancer Sister   . Lung cancer Brother   . Colon cancer Neg Hx     SOCIAL HISTORY:  reports that he quit smoking about 47 years ago. He has never used smokeless tobacco. He reports that he does not drink alcohol or use drugs.  PERFORMANCE STATUS: The patient's performance status is 2 - Symptomatic, <50% confined to bed  PHYSICAL EXAM: Most Recent Vital Signs: Blood pressure 94/60, pulse 86, temperature (!) 97.2 F (36.2 C), resp. rate 18, height 5'  9" (1.753 m), weight 180 lb 5.4 oz (81.8 kg), SpO2 99 %. BP 94/60 (BP Location: Right Arm)   Pulse 86   Temp (!) 97.2 F (36.2 C)   Resp 18   Ht 5\' 9"  (1.753 m)   Wt 180 lb 5.4 oz (81.8 kg)   SpO2 99%   BMI 26.63 kg/m  General appearance: alert, cooperative and appears stated age Neck: no adenopathy, supple, symmetrical, trachea midline and thyroid not enlarged, symmetric, no tenderness/mass/nodules Lungs: clear to auscultation bilaterally Heart: regular rate and rhythm Abdomen:  Soft, distended with ascites.  Examination was done prior to paracentesis. Rectal: deferred Extremities: extremities normal, atraumatic, no cyanosis or edema Skin: Skin color, texture, turgor normal. No rashes or lesions Lymph nodes: Cervical, supraclavicular, and axillary nodes normal. Neurologic: Grossly normal  LABORATORY DATA:  Results for orders placed or performed during the hospital encounter of 05/14/19 (from the past 48 hour(s))  Lipase, blood     Status: None   Collection Time: 05/14/19  6:43 PM  Result Value Ref Range   Lipase 33 11 - 51 U/L    Comment: Performed at [facility], [address], [place], [place] [address]  Comprehensive metabolic panel     Status: Abnormal   Collection Time: 05/14/19  6:43 PM  Result Value Ref Range   Sodium 131 (L) 135 - 145 mmol/L   Potassium 4.2 3.5 - 5.1 mmol/L   Chloride 91 (L) 98 - 111 mmol/L   CO2 28 22 - 32 mmol/L   Glucose, Bld 121 (H) 70 - 99 mg/dL   BUN 41 (H) 8 - 23 mg/dL   Creatinine, Ser 1.76 (H) 0.61 - 1.24 mg/dL   Calcium 7.6 (L) 8.9 - 10.3 mg/dL   Total Protein 6.1 (L) 6.5 - 8.1 g/dL   Albumin 2.1 (L) 3.5 - 5.0 g/dL   AST 28 15 - 41 U/L   ALT 17 0 - 44 U/L   Alkaline Phosphatase 79 38 - 126 U/L   Total Bilirubin 0.3 0.3 - 1.2 mg/dL   GFR calc non Af Amer 34 (L) >60 mL/min   GFR calc Af Amer 39 (L) >60 mL/min   Anion gap 12 5 - 15    Comment: Performed at [facility], [address], [place], [place] [address]  CBC     Status: Abnormal   Collection Time: 05/14/19  6:43 PM  Result Value Ref Range   WBC 16.0 (H) 4.0 - 10.5 K/uL   RBC 3.67 (L) 4.22 - 5.81 MIL/uL   Hemoglobin 10.8 (L) 13.0 - 17.0 g/dL   HCT 33.0 (L) 39.0 - 52.0 %   MCV 89.9 80.0 - 100.0 fL   MCH 29.4 26.0 - 34.0 pg   MCHC 32.7 30.0 - 36.0 g/dL   RDW 13.4 11.5 - 15.5 %   Platelets 391 150 - 400 K/uL   nRBC 0.0 0.0 - 0.2 %    Comment: Performed at [facility], [address] [place], [place] [address]  Troponin I (High Sensitivity)     Status:  Abnormal   Collection Time: 05/14/19  6:43 PM  Result Value Ref Range   Troponin I (High Sensitivity) 38 (H) <18 ng/L    Comment: (NOTE) Elevated high sensitivity troponin I (hsTnI) values and significant  changes across serial measurements may suggest ACS but many other  chronic and acute conditions are known to elevate hsTnI results.  Refer to the "Links" section for chest pain algorithms and additional  guidance.  Performed at [facility], [address] [place], [place] [address]   SARS CORONAVIRUS 2 (TAT 6-24 HRS) Nasopharyngeal Nasopharyngeal Swab     Status: None   Collection Time: 05/14/19  8:52 PM   Specimen: Nasopharyngeal Swab  Result Value Ref Range   SARS Coronavirus 2 NEGATIVE NEGATIVE    Comment: (NOTE) SARS-CoV-2 target nucleic acids are NOT DETECTED. The SARS-CoV-2 RNA is generally detectable in upper and lower respiratory specimens during the acute phase of infection. Negative results do not preclude SARS-CoV-2 infection, do not rule out co-infections with other pathogens, and should not be used as the sole basis for treatment or other patient management decisions. Negative results must be combined with clinical observations, patient history, and epidemiological information. The expected result is Negative. Fact Sheet for Patients: SugarRoll.be Fact Sheet for Healthcare Providers: https://www.woods-mathews.com/ This test is not yet approved or cleared by the [place] FDA and  has been authorized for detection and/or diagnosis of SARS-CoV-2 by FDA under an Emergency Use Authorization (EUA). This EUA will remain  in effect (meaning this test can be used) for the duration of the COVID-19 declaration under Section 56 4(b)(1) of the Act, 21 U.S.C. section 360bbb-3(b)(1), unless the authorization is terminated or revoked sooner. Performed at [facility], [place] [address], [place], [place] [address]   D-dimer,  quantitative (not at [facility])     Status: Abnormal   Collection Time: 05/15/19 12:12 AM  Result Value Ref Range   D-Dimer, Quant 18.77 (H) 0.00 - 0.50 ug/mL-FEU    Comment: (NOTE) At the manufacturer cut-off of 0.50 ug/mL FEU, this assay has been documented to exclude PE with a sensitivity and negative predictive value of 97 to 99%.  At this time, this assay has not been approved by the FDA to exclude DVT/VTE. Results should be correlated with clinical presentation. Performed at [facility] [address], [place], [place] [address]   Osmolality     Status: None   Collection Time: 05/15/19 12:12 AM  Result Value Ref Range   Osmolality 286 275 - 295 mOsm/kg    Comment: Performed at [facility] [address], [place], [place] [address]  Cortisol     Status: None   Collection Time: 05/15/19 12:12 AM  Result Value Ref Range   Cortisol, Plasma 20.8 ug/dL    Comment: (NOTE) AM    6.7 - 22.6 ug/dL PM   <10.0       ug/dL Performed at [facility] [address] [place], [place] [address]   Troponin I (High Sensitivity)     Status: Abnormal   Collection Time: 05/15/19 12:12 AM  Result Value Ref Range   Troponin I (High Sensitivity) 36.5 (H) <18 ng/L    Comment: (NOTE) Elevated high sensitivity troponin I (hsTnI) values and significant  changes across serial measurements may suggest ACS but many other  chronic and acute conditions are known to elevate hsTnI results.  Refer to the Links section for chest pain algorithms and additional  guidance. Performed at [facility], [address], [place], [place] [address]   CK total and CKMB (cardiac)not at [facility]     Status: None   Collection Time: 05/15/19 12:12 AM  Result Value Ref Range   Total CK 55 49 - 397 U/L   CK, MB 1.5 0.5 - 5.0 ng/mL   Relative Index RELATIVE INDEX IS INVALID 0.0 - 2.5    Comment: WHEN CK < 100 U/L  Performed at [facility], [place] [address], [place], [place] [address]   Comprehensive  metabolic panel     Status: Abnormal   Collection Time: 05/15/19  4:14 AM  Result Value Ref Range   Sodium 133 (L) 135 - 145 mmol/L   Potassium 4.6 3.5 - 5.1 mmol/L   Chloride 96 (L) 98 - 111 mmol/L   CO2 28 22 - 32 mmol/L   Glucose, Bld 85 70 - 99 mg/dL   BUN 36 (H) 8 - 23 mg/dL   Creatinine, Ser 1.60 (H) 0.61 - 1.24 mg/dL   Calcium 7.4 (L) 8.9 - 10.3 mg/dL   Total Protein 5.2 (L) 6.5 - 8.1 g/dL   Albumin 1.9 (L) 3.5 - 5.0 g/dL   AST 24 15 - 41 U/L   ALT 14 0 - 44 U/L   Alkaline Phosphatase 69 38 - 126 U/L   Total Bilirubin 0.7 0.3 - 1.2 mg/dL   GFR calc non Af Amer 38 (L) >60 mL/min   GFR calc Af Amer 44 (L) >60 mL/min   Anion gap 9 5 - 15    Comment: Performed at [facility], [address] [place], [place] [address]  CBC     Status: Abnormal   Collection Time: 05/15/19  4:14 AM  Result Value Ref Range   WBC 13.9 (H) 4.0 - 10.5 K/uL   RBC 3.43 (L) 4.22 - 5.81 MIL/uL   Hemoglobin 10.3 (L) 13.0 - 17.0 g/dL   HCT 31.4 (L) 39.0 - 52.0 %   MCV 91.5 80.0 - 100.0 fL   MCH 30.0 26.0 - 34.0 pg   MCHC 32.8 30.0 - 36.0 g/dL   RDW 13.6 11.5 - 15.5 %   Platelets 358 150 - 400 K/uL   nRBC 0.0 0.0 - 0.2 %    Comment: Performed at [facility], [address], [place], [place] [address]  TSH     Status: None   Collection Time: 05/15/19  4:14 AM  Result Value Ref Range   TSH 3.829 0.350 - 4.500 uIU/mL    Comment: Performed by a 3rd Generation assay with a functional sensitivity of <=0.01 uIU/mL. Performed at [facility], [address] [place], [place] [address]   Troponin I (High Sensitivity)     Status: Abnormal   Collection Time: 05/15/19  4:36 AM  Result Value Ref Range   Troponin I (High Sensitivity) 39 (H) <18 ng/L    Comment: (NOTE) Elevated high sensitivity troponin I (hsTnI) values and significant  changes across serial measurements may suggest ACS but many other  chronic and acute conditions are known to elevate hsTnI results.  Refer to the "Links" section for chest  pain algorithms and additional  guidance. Performed at [facility], [address] [place], [place] [address]   Amylase, pleural or peritoneal fluid     Status: None   Collection Time: 05/16/19 11:25 AM  Result Value Ref Range   Amylase, Fluid 15 U/L   Fluid Type-FAMY ASCITIES     Comment: Performed at [facility], [address], [place], [name] [address]  Lactate dehydrogenase (pleural or peritoneal fluid)     Status: Abnormal   Collection Time: 05/16/19 11:25 AM  Result Value Ref Range   LD, Fluid 1,633 (H) 3 - 23 U/L    Comment: (NOTE) Results should be evaluated in conjunction with serum values    Fluid Type-FLDH ASCITIES     Comment: Performed at [facility], [address],  [place], [place] [address]  Protein, pleural or peritoneal fluid     Status: None   Collection Time: 05/16/19 11:25 AM  Result Value Ref Range   Total protein, fluid 3.1 g/dL    Comment: (NOTE) No normal range established for this test Results should be evaluated in conjunction with serum values    Fluid Type-FTP ASCITIES     Comment: Performed at [facility], [address] [place] [address], [place], [place] [address]  Body fluid cell count with differential     Status: Abnormal   Collection Time: 05/16/19 11:25 AM  Result Value Ref Range   Fluid Type-FCT Body Fluid    Color, Fluid AMBER (A) YELLOW   Appearance, Fluid CLOUDY (A) CLEAR   Total Nucleated Cell Count, Fluid 2,137 (H) 0 - 1,000 cu mm   Neutrophil Count, Fluid 5 0 - 25 %   Lymphs, Fluid 90 %   Monocyte-Macrophage-Serous Fluid 5 (L) 50 - 90 %   Eos, Fluid 0 %    Comment: Performed at [facility], [address] [place], [place] [address]  Gram stain     Status: None   Collection Time: 05/16/19 11:25 AM   Specimen: Ascitic  Result Value Ref Range   Specimen Description ASCITIC    Special Requests NONE    Gram Stain      CYTOSPIN SMEAR ASCITIC FLUID WBC PRESENT, PREDOMINANTLY MONONUCLEAR NO ORGANISMS SEEN [facility] Performed at [facility], [address] [place], [place] [address]    Report Status 05/16/2019 FINAL   CBC     Status: Abnormal   Collection Time: 05/16/19  1:14 PM  Result Value Ref Range   WBC 14.7 (H) 4.0 - 10.5 K/uL   RBC 3.46 (L) 4.22 - 5.81 MIL/uL   Hemoglobin 10.2 (L) 13.0 - 17.0 g/dL   HCT 31.6 (L) 39.0 - 52.0 %   MCV 91.3 80.0 - 100.0 fL   MCH 29.5 26.0 - 34.0 pg   MCHC 32.3 30.0 - 36.0 g/dL   RDW 13.7 11.5 - 15.5 %   Platelets 334 150 - 400 K/uL   nRBC 0.0 0.0 - 0.2 %    Comment: Performed at [facility], [address] [place], [place] [id number]  Basic metabolic panel     Status: Abnormal   Collection Time: 05/16/19  1:14 PM  Result Value Ref Range   Sodium 134 (L) 135 - 145 mmol/L   Potassium 3.7 3.5 - 5.1 mmol/L    Comment: DELTA CHECK NOTED   Chloride 99 98 - 111 mmol/L   CO2 25 22 - 32 mmol/L   Glucose, Bld 124 (H) 70 - 99 mg/dL   BUN 35 (H) 8 - 23 mg/dL   Creatinine, Ser 1.56 (H) 0.61 - 1.24 mg/dL   Calcium 7.2 (L) 8.9 - 10.3 mg/dL   GFR calc non Af Amer 39 (L) >60 mL/min   GFR calc Af Amer 46 (L) >60 mL/min   Anion gap 10 5 - 15    Comment: Performed at [facility], [address] [place], [place] [address]      RADIOGRAPHY: Dg Chest 2 View  Result Date: 05/16/2019 CLINICAL DATA:  85 year old male with shortness of breath. EXAM: CHEST - 2 VIEW COMPARISON:  CT Chest, Abdomen, and Pelvis 05/11/2019, and earlier. FINDINGS: Calcified pleural plaques in both lungs. Mediastinal contours remain normal. Visualized tracheal air column is within normal limits. No pneumothorax, pulmonary edema, pleural effusion or acute pulmonary opacity. Calcified aortic atherosclerosis. No  acute osseous abnormality identified. Paucity of bowel gas in the upper abdomen. IMPRESSION: Stable calcified pleural plaques. No acute cardiopulmonary abnormality. Electronically Signed   By: Genevie Ann M.D.   On: 05/16/2019 10:11   Nm Pulmonary Perf And Vent  Result Date: 05/16/2019 CLINICAL DATA:  Shortness of  breath, worsening effort tolerance, dyspnea, question peritoneal carcinomatosis by CT, elevated D-dimer question pulmonary embolism EXAM: NUCLEAR MEDICINE VENTILATION - PERFUSION LUNG SCAN TECHNIQUE: Ventilation images were obtained in multiple projections using inhaled aerosol Tc-86m DTPA. Perfusion images were obtained in multiple projections after intravenous injection of Tc-45m MAA. RADIOPHARMACEUTICALS:  30 mCi of Tc-70m DTPA aerosol inhalation and 1.9 mCi Tc84m MAA IV COMPARISON:  None Correlation: Chest radiograph 05/16/2019 FINDINGS: Ventilation: Diminished ventilation in RIGHT upper lobe. Central airway deposition of aerosol. No additional ventilation defects. Perfusion: Absent perfusion RIGHT upper lobe. Additional small subsegmental perfusion defect lingula which correlates to a calcified pleural plaque on CT. No additional perfusion defects. Chest radiograph: Calcified pleural plaque disease bilaterally. IMPRESSION: Small subsegmental perfusion defect in lingula corresponds to a calcified pleural plaque on CT. Absent perfusion RIGHT upper lobe with diminished though not absent ventilation in RIGHT upper lobe, ventilation better than perfusion. Solitary lobar mismatch represents a low probability for pulmonary embolism by PIOPED II criteria. Overall findings represent a low probability for pulmonary embolism. Electronically Signed   By: Lavonia Dana M.D.   On: 05/16/2019 11:44   US Paracentesis  Result Date: 05/16/2019 INDICATION: Ascites EXAM: ULTRASOUND GUIDED DIAGNOSTIC AND THERAPEUTIC PARACENTESIS MEDICATIONS: None. COMPLICATIONS: None immediate. PROCEDURE: Procedure, benefits, and risks of procedure were discussed with patient. Written informed consent for procedure was obtained. Time out protocol followed. Adequate collection of ascites localized by ultrasound in RIGHT lower quadrant. Skin prepped and draped in usual sterile fashion. Skin and soft tissues anesthetized with 10 mL of 1% lidocaine. 5  [place] Y[place]h catheter placed into peritoneal cavity. 4.2 L of serosanguineous ascitic fluid aspirated by vacuum bottle suction. Procedure tolerated well by patient without immediate complication. FINDINGS: A total of approximately 4.2 L of ascitic fluid was removed. Samples were sent to the laboratory as requested by the clinical team. IMPRESSION: Successful ultrasound-guided paracentesis yielding 4.2 liters of peritoneal fluid. Electronically Signed   By: Lavonia Dana M.D.   On: 05/16/2019 12:01   Dg Abd Acute 2+v W 1v Chest  Result Date: 05/14/2019 CLINICAL DATA:  Vomiting. Constipation. EXAM: DG ABDOMEN ACUTE W/ 1V CHEST COMPARISON:  Body CT May 11, 2019 FINDINGS: Nonobstructive bowel gas pattern. Centralization of the bowels due to moderate volume ascites. Normal amount of formed stool throughout the colon. Normal cardiac silhouette. Partially calcified pleural plaques noted. Calcific atherosclerotic disease of the aorta. IMPRESSION: 1. Nonobstructive bowel gas pattern. 2. Moderate volume ascites. Electronically Signed   By: Fidela Salisbury M.D.   On: 05/14/2019 19:31        ASSESSMENT and PLAN:  1.  Malignant ascites: - Presentation with severe weakness, nausea and vomiting for the past few weeks. -CT CAP on 05/11/2019 showed ascites with suspicious nodular, irregular peritoneal thickening most pronounced along the inferior and posterior extent of fluid.  There is diffuse circumferential thickening of the thoracic esophagus with small adjacent 11 mm paraesophageal lymph node near the diaphragmatic hiatus.  CT chest showed extensive scattered calcified pleural plaques which are similar in extent to the study from 2009. -Patient reportedly had colonoscopy but I could not see the report on epic. - Paracentesis on 05/16/2019 with 4.2 L of serosanguineous fluid removed. -  Cytology is pending at this time.  We will check a CEA level. - Would recommend EGD and possible biopsy of suspicious lesion at  the thickening. - Would also recommend biopsy of the peritoneal nodules if the cytology is inconclusive.  2.  CKD: -He has mild worsening of the creatinine from his baseline.  Likely from dehydration.  3.  Normocytic anemia: - Hemoglobin is 10.2.  MCV is 91.3. -Would recommend ferritin, iron panel, B12, methylmalonic acid, folate, and copper levels. -We will also check SPEP.    All questions were answered. The patient knows to call the clinic with any problems, questions or concerns. We can certainly see the patient much sooner if necessary.    Derek Jack

## 2019-05-16 NOTE — Procedures (Signed)
PreOperative Dx: Ascites, question malignant by CT Postoperative Dx: Ascites, question malignant by CT Procedure:   US guided paracentesis Radiologist:  Thornton Papas Anesthesia:  10 ml of1% lidocaine Specimen:  4.2 L of serosanguinous ascitic fluid EBL:   < 1 ml Complications: None

## 2019-05-16 NOTE — Progress Notes (Signed)
BP  86/46 pulse 86.  Contacted Dr, Denton Brick

## 2019-05-16 NOTE — Consult Note (Signed)
'@LOGO' @   Referring Provider: No ref. provider found Primary Care Physician:  Susy Frizzle, MD Primary Gastroenterologist:  Dr. Oneida Alar  Date of Admission: 05/14/19 Date of Consultation: 05/16/19  Reason for Consultation:  ? EGD  HPI:  Thomas [name] is a 85 y.o. year old male with HTN, HLD, vitamin D deficiency, reported history of elevated Psa, h/o melanoma, and new diagnosis of mesothelioma with ascites on 05/11/19 via CT who presented to the ED on 05/14/19 with c/o nausea, vomiting, and generalized weakness over the last 3-4 weeks. Also with abdominal distention.   In the ED: Pulse was elevated at 102, BP 99/56, T 98, O2 stats 97% on RA. Abdominal Xray with nonobstructive bowel gas pattern and moderate volume ascites. Labs with WBC 16, hemoglobin 10.8 with normocytic indices, HCT 33.0, Na 131, K 4.2, BUN 41, Cr 1.76, albumin 2.1, AST 28, ALT 17, Alk Phos 79, Bilirubin 0.3. Lipase normal.  EKG with nsr at 100, ST depression in v4-v6. Troponin 38. ? Demand ischemia vs secondary to ARF/cardiac murmur.  Patient was admitted for n/v, hyponatremia, dehydration, and acute renal failure and was started on IV fluids, Zofran PRN, and Protonix BID. Also with plans for diagnostic paracentesis. Since admission, d-dimer was found to be elevated at 18.77 and troponin is persistently elevated, but overall stable. Plans for V/Q scan today.  Today he states he has had nausea, vomiting, and weakness over the last 2 months. Not changing in severity. No hematemesis. Vomiting always after eating. Reports he can only eat a few bites of food and this will usually be followed by vomiting. Also reports a lot of phlegm production over the last couple months which he thinks is contributing to his vomiting as he is having to cough hard to get the phlegm up. Denies abdominal pain. Denies acid reflux or heartburn. No trouble swallowing. He does have some sharp pain in the mid chest when swallowing. Occurs with liquids.  Doesn't know if occurs with solids as he hasn't been eating much. Abdomen was distended. Has been present over the last 2.5 months. Had paracentesis this morning. Reports he was not having regular BMs as he hasn't been eating. Thinks he had been a couple weeks since his last BM. He was given a suppository yesterday and had 3 BMs at least. No blood in the stool or black stools. Thinks he has lost some weight but it was hard to tell with the fluid accumulation in his abdomen.   He tried eating sausage and bread this morning but vomited right after.    No NSAIDs.   Denies fever, or chills. No lightheadedness, dizziness, or pre-syncope.    Occasional alcohol use. Usually a couple drinks every evening. None in the last 2 months.   Past Medical History:  Diagnosis Date  . ANEMIA 02/08/2008  . ARTHRITIS 02/08/2008  . Cataract    left eye  . 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 ELEVATED PROSTATE SPECIFIC ANTIGEN 02/08/2008  . GLAUCOMA, LEFT EYE 02/07/2009   left eye  . HISTORY OF ASBESTOS EXPOSURE 02/08/2008  . HYPERLIPIDEMIA 02/08/2008  . Hypertension   . HYPOTENSION 02/18/2010  . HYPOTHYROIDISM 02/08/2008  . Melanoma ([place])    left shoulder  . Mesothelioma ([place]) 05/11/2019   on CT  . VITAMIN D DEFICIENCY 02/07/2009    Past Surgical History:  Procedure Laterality Date  . dupyreenes contractures-bilat      Prior to Admission medications   Medication Sig Start Date End Date Taking? Authorizing Provider  amLODipine (NORVASC) 5 MG tablet TAKE ONE TABLET BY MOUTH ONCE DAILY. Patient taking differently: Take 5 mg by mouth daily.  09/28/18  Yes Susy Frizzle, MD  atenolol-chlorthalidone (TENORETIC) 50-25 MG tablet TAKE 1/2 TABLET BY MOUTH EVERY MORNING. Patient taking differently: Take 0.5 tablets by mouth daily.  03/31/19  Yes Susy Frizzle, MD  latanoprost (XALATAN) 0.005 % ophthalmic solution Place 1 drop into both eyes at bedtime.    Yes [provider]  Multiple Vitamins-Minerals (EYE  VITAMINS) CAPS Take 1 capsule by mouth daily.    Yes [provider]  ondansetron (ZOFRAN ODT) 4 MG disintegrating tablet Take 1 tablet (4 mg total) by mouth every 8 (eight) hours as needed for vomiting. 74m ODT q4 hours prn nausea/vomit 05/11/19  Yes Mesner, JCorene Cornea MD  pantoprazole (PROTONIX) 20 MG tablet Take 1 tablet (20 mg total) by mouth daily. 05/11/19  Yes Mesner, JCorene Cornea MD  fluticasone (FLONASE) 50 MCG/ACT nasal spray Place 2 sprays into both nostrils daily. 05/01/19 05/11/19  PSusy Frizzle MD  levocetirizine (XYZAL) 5 MG tablet Take 1 tablet (5 mg total) by mouth every evening. 05/01/19 05/11/19  PSusy Frizzle MD    Current Facility-Administered Medications  Medication Dose Route Frequency Provider Last Rate Last Dose  . acetaminophen (TYLENOL) tablet 650 mg  650 mg Oral Q6H PRN KJani Gravel MD       Or  . acetaminophen (TYLENOL) suppository 650 mg  650 mg Rectal Q6H PRN KJani Gravel MD      . atenolol (TENORMIN) tablet 12.5 mg  12.5 mg Oral Daily KJani Gravel MD   12.5 mg at 05/15/19 0857  . dextrose 5 %-0.9 % sodium chloride infusion   Intravenous Continuous ERoxan Hockey MD 75 mL/hr at 05/16/19 0702    . enoxaparin (LOVENOX) injection 40 mg  40 mg Subcutaneous Q24H Emokpae, Courage, MD   40 mg at 05/15/19 2238  . feeding supplement (PRO-STAT SUGAR FREE 64) liquid 30 mL  30 mL Oral BID KJani Gravel MD   30 mL at 05/15/19 2239  . latanoprost (XALATAN) 0.005 % ophthalmic solution 1 drop  1 drop Both Eyes QHS KJani Gravel MD   1 drop at 05/15/19 2230  . ondansetron (ZOFRAN) injection 4 mg  4 mg Intravenous Q6H PRN KJani Gravel MD   4 mg at 05/15/19 0853  . pantoprazole (PROTONIX) EC tablet 40 mg  40 mg Oral BID KJani Gravel MD   40 mg at 05/15/19 2238    Allergies as of 05/14/2019 - Review Complete 05/14/2019  Allergen Reaction Noted  . Penicillins Rash 02/08/2008    Family History  Problem Relation Age of Onset  . Hypertension Mother   . Heart disease Brother   . Cancer  Sister     Social History   Socioeconomic History  . Marital status: Married    Spouse name: Not on file  . Number of children: Not on file  . Years of education: Not on file  . Highest education level: Not on file  Occupational History  . Not on file  Social Needs  . Financial resource strain: Not on file  . Food insecurity    Worry: Not on file    Inability: Not on file  . Transportation needs    Medical: Not on file    Non-medical: Not on file  Tobacco Use  . Smoking status: Former Smoker    Quit date: 10/08/1971    Years since quitting: 47.6  . Smokeless  tobacco: Never Used  Substance and Sexual Activity  . Alcohol use: No    Alcohol/week: 0.0 standard drinks  . Drug use: No  . Sexual activity: Not Currently  Lifestyle  . Physical activity    Days per week: Not on file    Minutes per session: Not on file  . Stress: Not on file  Relationships  . Social Herbalist on phone: Not on file    Gets together: Not on file    Attends religious service: Not on file    Active member of club or organization: Not on file    Attends meetings of clubs or organizations: Not on file    Relationship status: Not on file  . Intimate partner violence    Fear of current or ex partner: Not on file    Emotionally abused: Not on file    Physically abused: Not on file    Forced sexual activity: Not on file  Other Topics Concern  . Not on file  Social History Narrative  . Not on file    Review of Systems: Gen: See HPI CV: Denies chest pain, heart palpitations Resp: Denies shortness of breath  GI: See HPI GU : Denies urinary burning, urinary frequency, urinary incontinence.  MS: Denies joint pain,swelling, cramping Derm: Denies rash, itching, dry skin Psych: Denies depression, anxiety Heme: Denies bruising, bleeding  Physical Exam: Vital signs in last 24 hours: Temp:  [97.7 F (36.5 C)-98.4 F (36.9 C)] 97.7 F (36.5 C) (09/08 0513) Pulse Rate:  [80-89] 80  (09/08 0513) Resp:  [16-18] 18 (09/08 0513) BP: (95-106)/(60-68) 96/63 (09/08 0513) SpO2:  [95 %-98 %] 95 % (09/08 0513) Weight:  [81.8 kg] 81.8 kg (09/08 0500) Last BM Date: 05/01/19 General:   Alert, pleasant and cooperative in NAD. Ill appearing.  Head:  Normocephalic and atraumatic. Eyes:  Sclera clear, no icterus. Conjunctiva pink. Ears:  Normal auditory acuity. Nose:  No deformity, discharge,  or lesions. Lungs:  Clear throughout to auscultation.   No wheezes, crackles, or rhonchi. No acute distress. Heart:  Regular rate and rhythm; no murmurs, clicks, rubs,  or gallops. Abdomen:  Soft, nontender and nondistended. Normal bowel sounds, without guarding, and without rebound.  Band-Aid in place on right side from paracentesis today.  No pain or erythema at site. Rectal:  Deferred. Msk:  Symmetrical without gross deformities. Normal posture. Pulses:  Normal pulses noted. Extremities:  Without clubbing or edema. Neurologic:  Alert and  oriented x4;  grossly normal neurologically. Skin:  Intact without significant lesions or rashes. Psych: Normal mood and affect.  Intake/Output from previous day: 09/07 0701 - 09/08 0700 In: 1081.6 [P.O.:480; I.V.:601.6] Out: 5 [Urine:1; Stool:4] Intake/Output this shift: No intake/output data recorded.  Lab Results: Recent Labs    05/14/19 1843 05/15/19 0414  WBC 16.0* 13.9*  HGB 10.8* 10.3*  HCT 33.0* 31.4*  PLT 391 358   BMET Recent Labs    05/14/19 1843 05/15/19 0414  NA 131* 133*  K 4.2 4.6  CL 91* 96*  CO2 28 28  GLUCOSE 121* 85  BUN 41* 36*  CREATININE 1.76* 1.60*  CALCIUM 7.6* 7.4*   LFT Recent Labs    05/14/19 1843 05/15/19 0414  PROT 6.1* 5.2*  ALBUMIN 2.1* 1.9*  AST 28 24  ALT 17 14  ALKPHOS 79 69  BILITOT 0.3 0.7   Studies/Results: Dg Abd Acute 2+v W 1v Chest  Result Date: 05/14/2019 CLINICAL DATA:  Vomiting. Constipation. EXAM: DG  ABDOMEN ACUTE W/ 1V CHEST COMPARISON:  Body CT May 11, 2019 FINDINGS:  Nonobstructive bowel gas pattern. Centralization of the bowels due to moderate volume ascites. Normal amount of formed stool throughout the colon. Normal cardiac silhouette. Partially calcified pleural plaques noted. Calcific atherosclerotic disease of the aorta. IMPRESSION: 1. Nonobstructive bowel gas pattern. 2. Moderate volume ascites. Electronically Signed   By: Fidela Salisbury M.D.   On: 05/14/2019 19:31    Impression:  85 y.o. year old male with HTN, HLD, vitamin D deficiency, reported history of elevated Psa, h/o melanoma, and new diagnosis of mesothelioma with ascites on 05/11/19 via CT who presented to the ED on 05/14/19 with c/o nausea, vomiting, and generalized weakness over the last 3-4 weeks. Also with abdominal distention. In the ED, abdominal Xray with nonobstructive bowel gas pattern and moderate volume ascites. Labs with WBC 16, hemoglobin 10.8 with normocytic indices, HCT 33.0, Na 131, K 4.2, BUN 41, Cr 1.76, albumin 2.1, AST 28, ALT 17, Alk Phos 79, Bilirubin 0.3. Lipase normal. EKG with nsr at 100, ST depression in v4-v6. Troponin 38. ? Demand ischemia vs secondary to ARF/cardiac murmur. Patient was admitted for n/v, hyponatremia, dehydration, and acute renal failure and was started on IV fluids, Zofran PRN, and Protonix BID.   N/V: Nausea with vomiting occurring after meals for the last 2 months. Also with pain in his mid chest when swallowing. Denies feeling of food getting hung.  Denies hematemesis, GERD symptoms, abdominal pain, and NSAID use. Has had decreased BMs likely secondary to decreased intake. Abdominal X-ray without obstruction. No hematochezia or melena. Used to drink a couple alcoholic beverages a night, but none in the last 2 months. CT on 05/11/19 with new moderate volume of low-attenuation intraperitoneal fluid with suspicious nodular, irregular peritoneal thickening suspicious for malignant peritoneal mesothelioma. Also noted is diffuse low attenuation, circumferential  thickening of the thoracic esophagus with small adjacent 11 mm paraoesophageal lymph node and edematous mural thickening of the small bowel. Patient has also had new onset of anemia first noted in August 2020, but indices are normocytic and hemoglobin is stable at this time.   Suspect symptoms are likely related to malignancy. Mesothelioma with likely malignant ascites was diagnosed recently, but query whether patient has esophageal malignancy with findings on CT and no significant GERD symptoms or NSAID use. This thickening is likely contributing to his reported odynophagia. Patient would need EGD for further evaluation. He is currently receiving Lovenox for DVT prevention and is on regular diet. He did have elevated but flat troponin. D dimer was elevated. V/Q scan today was with low probability for pulmonary embolism. Will discuss with Dr. Oneida Alar.    Ascites: Likely related to malignancy. Patient had diagnostic paracentesis today with removal of 4.2 L of fluid.   Plan: Discuss EGD timing with Dr. Oneida Alar Continue Protonix 40 mg BID Schedule Zofran prior to meals to see if this helps with postprandial vomiting.  Continue supportive measures.     LOS: 1 day    05/16/2019, 8:49 AM   Aliene Altes, PA-C [facility]

## 2019-05-17 ENCOUNTER — Encounter (HOSPITAL_COMMUNITY)
Admission: EM | Disposition: A | Discharge status: Home / Self Care | Payer: Self-pay | Source: Home / Self Care | Attending: Family Medicine

## 2019-05-17 DIAGNOSIS — E43 Unspecified severe protein-calorie malnutrition: Secondary | ICD-10-CM

## 2019-05-17 DIAGNOSIS — R188 Other ascites: Secondary | ICD-10-CM

## 2019-05-17 LAB — GLUCOSE, CAPILLARY: Glucose-Capillary: 85 mg/dL (ref 70–99)

## 2019-05-17 LAB — PH, BODY FLUID: pH, Body Fluid: 7.3

## 2019-05-17 SURGERY — EGD (ESOPHAGOGASTRODUODENOSCOPY)
Anesthesia: Moderate Sedation

## 2019-05-17 MED ORDER — SODIUM CHLORIDE 0.9 % IV BOLUS
250.0000 mL | Freq: Once | INTRAVENOUS | Status: AC
  Administered 2019-05-17: 250 mL via INTRAVENOUS

## 2019-05-17 MED ORDER — DEXTROSE 50 % IV SOLN
INTRAVENOUS | Status: AC
  Filled 2019-05-17: qty 50

## 2019-05-17 MED ORDER — SODIUM CHLORIDE 0.9 % IV BOLUS
500.0000 mL | Freq: Once | INTRAVENOUS | Status: AC
  Administered 2019-05-17: 500 mL via INTRAVENOUS

## 2019-05-17 MED ORDER — DEXTROSE 50 % IV SOLN
50.0000 mL | Freq: Once | INTRAVENOUS | Status: AC
  Administered 2019-05-17: 50 mL via INTRAVENOUS

## 2019-05-17 NOTE — Progress Notes (Signed)
Notified Dr. Denton Brick of patient having low BP this afternoon. Reports feeling "weak", denies dizziness or lightheadedness. NS 500 ml bolus adminstered as ordered. CBG 85, checked per order. Jeralyn Bennett, RN in endo aware of delay with low BP and bolus. Stated will call back once bolus completed for endo procedure. Donavan Foil, RN

## 2019-05-17 NOTE — Care Management Important Message (Signed)
Important Message  Patient Details  Name: Thomas [name] MRN: [id number] Date of Birth: 1930-05-13   Medicare Important Message Given:  Yes     [name] 05/17/2019, 1:57 PM

## 2019-05-17 NOTE — Progress Notes (Signed)
Patient Demographics:    Thomas [name], is a 85 y.o. male, DOB - 04/20/1930, [id number]  Admit date - 05/14/2019   Admitting Physician Jani Gravel, MD  Outpatient Primary MD for the patient is Susy Frizzle, MD  LOS - 2   Chief Complaint  Patient presents with   Emesis    for past week        Subjective:    Thomas [name] today has no fevers,    No chest pain,   --Hungry while n.p.o. for EGD however EGD postponed due to hypotension requiring IV fluid boluses-- daughter Arbie Cookey at bedside,  Assessment  & Plan :    Principal Problem:   ARF (acute renal failure) ([place]) Active Problems:   Essential hypertension   Hyponatremia   Mesothelioma ([place])   Protein-calorie malnutrition, severe ([place])   Elevated troponin   Esophageal thickening   Nausea and vomiting   Malignant ascites   Normocytic anemia   Ascites   Brief summary 85 y.o. male, w hypertension, hyperlipidemia, anemia, vitamin D deficiency,  elevated Psa (could not confirm , labs in Epic normal),  h/o melanoma, recent new diagnosis of mesothelioma w ascites emesis and abdominal discomfort  A/p 1)Recurrent emesis--- abdominal imaging without obstructive findings, continue IV fluids, continue antiemetics, liquid diet and advance slowly -EGD postponed until 05/18/2019 due to hypotension requiring IV fluid boluses  2)Ascites--- query if mesothelioma related, previously scheduled to see Dr. Georga Hacking--- status post diagnostic paracentesis for cytology, Gram stain and culture NGTD -Paracentesis on 05/16/2019 with 4.2 L of serosanguineous fluid removed. - Cytology is with atypical cells/inconclusive - CEA level pending. -  Awaiting EGD and possible biopsy of suspicious lesion at the esophageal thickening. -  Oncologist recommends  biopsy of the peritoneal nodules if the cytology is inconclusive. -Peritoneal fluid cell count is >  2100.Marland Kitchen Suspect inflammatory (/reactive) rather than Heliodoro SBP -Peritoneal fluid LDH is 1633  3)Elevated d-dimer--may be related to underlying malignancy/mesothelioma--- unable to do CTA chest for PE rule out due to elevated creatinine,   --VQ scan low probability for PE  4)HTN--continue to hold atenolol 12.5 due to soft BP  5)AKI----acute kidney injury on CKD stage - III     creatinine on admission= 1.76  ,   baseline creatinine =  1.2 to 1.3   , creatinine is now= 1.56   , renally adjust medications, avoid nephrotoxic agents/dehydration/hypotension  Disposition/Need for in-Hospital Stay- patient unable to be discharged at this time due to recurrent emesis and the patient was ascites awaiting diagnostic paracentesis--requiring IV fluids until oral intake is reasonable -EGD postponed due to hypotension requiring IV fluid boluses  Code Status : Full  Family Communication:    (patient is alert, awake and coherent) -Discussed with daughter Arbie Cookey at bedside  Disposition Plan  : TBD  Consults  :  Oncology/radiology/Gi  DVT Prophylaxis  : TEDs/ SCDs    Lab Results  Component Value Date   PLT 334 05/16/2019    Inpatient Medications  Scheduled Meds:  feeding supplement (PRO-STAT SUGAR FREE 64)  30 mL Oral BID   latanoprost  1 drop Both Eyes QHS   ondansetron (ZOFRAN) IV  4 mg Intravenous TID WC   pantoprazole  40 mg Oral BID  Continuous Infusions:  dextrose 5 % and 0.9% NaCl 75 mL/hr at 05/17/19 0020   PRN Meds:.acetaminophen **OR** acetaminophen, ondansetron (ZOFRAN) IV    Anti-infectives (From admission, onward)   None        Objective:   Vitals:   05/17/19 1320 05/17/19 1452 05/17/19 1520 05/17/19 1655  BP: (!) 83/47 (!) 88/53 (!) 86/58 (!) 91/52  Pulse: 73  75 70  Resp:    16  Temp:    98 F (36.7 C)  TempSrc:    Oral  SpO2:    95%  Weight:      Height:        Wt Readings from Last 3 Encounters:  05/17/19 77.9 kg  05/11/19 79.8 kg  05/09/19 79.8 kg       Intake/Output Summary (Last 24 hours) at 05/17/2019 1733 Last data filed at 05/17/2019 1654 Gross per 24 hour  Intake 750 ml  Output 750 ml  Net 0 ml     Physical Exam  Gen:- Awake Alert,  In no apparent distress  HEENT:- [place].AT, No sclera icterus Ears- HOH Neck-Supple Neck,No JVD,.  Lungs-  CTAB , fair symmetrical air movement CV- S1, S2 normal, regular  Abd-  +ve B.Sounds, Abd Soft, much less distended after paracentesis, no significant tenderness Extremity/Skin:- No  edema, pedal pulses present  Psych-affect is appropriate, oriented x3 Neuro-generalized weakness, no new focal deficits, no tremors   Data Review:   Micro Results Recent Results (from the past 240 hour(s))  Novel Coronavirus, NAA ([facility])     Status: None   Collection Time: 05/09/19 10:14 AM   Specimen: Oropharyngeal(OP) collection in vial transport medium   OROPHARYNGEA  TESTING  Result Value Ref Range Status   SARS-CoV-2, NAA Not Detected Not Detected Final    Comment: This nucleic acid amplification test was developed and its perfomance characteristics determined by [employer]. Nucleic acid amplification tests include PCR and TMA. This test has not been FDA cleared or approved. This test has been authorized by FDA under an Emergency Use Authorization (EUA). This test is only authorized for the duration of time the declaration that circumstances exist justifying the authorization of the emergency use of in vitro diagnostic tests for detection of SARS-CoV-2 virus and/or diagnosis of COVID-19 infection under section 564(b)(1) of the Act, 21 U.S.C. [id number]) (1), unless the authorization is terminated or revoked sooner. When diagnostic testing is negative, the possibility of a false negative result should be considered in the context of a patient's recent exposures and the presence of clinical signs and symptoms consistent with COVID-19. An individual without symptoms of COVID-19 and who  is not shedding SARS-CoV-2 virus would  expect to have a negative (not detected) result in this assay.   SARS CORONAVIRUS 2 (TAT 6-24 HRS) Nasopharyngeal Nasopharyngeal Swab     Status: None   Collection Time: 05/14/19  8:52 PM   Specimen: Nasopharyngeal Swab  Result Value Ref Range Status   SARS Coronavirus 2 NEGATIVE NEGATIVE Final    Comment: (NOTE) SARS-CoV-2 target nucleic acids are NOT DETECTED. The SARS-CoV-2 RNA is generally detectable in upper and lower respiratory specimens during the acute phase of infection. Negative results do not preclude SARS-CoV-2 infection, do not rule out co-infections with other pathogens, and should not be used as the sole basis for treatment or other patient management decisions. Negative results must be combined with clinical observations, patient history, and epidemiological information. The expected result is Negative. Fact Sheet for Patients: SugarRoll.be Fact Sheet for  Healthcare Providers: https://www.woods-mathews.com/ This test is not yet approved or cleared by the [place] and  has been authorized for detection and/or diagnosis of SARS-CoV-2 by FDA under an Emergency Use Authorization (EUA). This EUA will remain  in effect (meaning this test can be used) for the duration of the COVID-19 declaration under Section 56 4(b)(1) of the Act, 21 U.S.C. section 360bbb-3(b)(1), unless the authorization is terminated or revoked sooner. Performed at [facility], [place] [address] [place], [place] [address]   Culture, body fluid-bottle     Status: None (Preliminary result)   Collection Time: 05/16/19 11:25 AM   Specimen: Ascitic  Result Value Ref Range Status   Specimen Description ASCITIC  Final   Special Requests 10CC BOTTLES DRAWN AEROBIC AND ANAEROBIC  Final   Culture   Final    NO GROWTH < 24 HOURS Performed at [facility], [address], [place], [place] [address]    Report  Status PENDING  Incomplete  Gram stain     Status: None   Collection Time: 05/16/19 11:25 AM   Specimen: Ascitic  Result Value Ref Range Status   Specimen Description ASCITIC  Final   Special Requests NONE  Final   Gram Stain   Final    CYTOSPIN SMEAR ASCITIC FLUID WBC PRESENT, PREDOMINANTLY MONONUCLEAR NO ORGANISMS SEEN [facility] Performed at [facility], [address], [place],  [address]    Report Status 05/16/2019 FINAL  Final    Radiology Reports Dg Chest 2 View  Result Date: 05/16/2019 CLINICAL DATA:  85 year old male with shortness of breath. EXAM: CHEST - 2 VIEW COMPARISON:  CT Chest, Abdomen, and Pelvis 05/11/2019, and earlier. FINDINGS: Calcified pleural plaques in both lungs. Mediastinal contours remain normal. Visualized tracheal air column is within normal limits. No pneumothorax, pulmonary edema, pleural effusion or acute pulmonary opacity. Calcified aortic atherosclerosis. No acute osseous abnormality identified. Paucity of bowel gas in the upper abdomen. IMPRESSION: Stable calcified pleural plaques. No acute cardiopulmonary abnormality. Electronically Signed   By: Genevie Ann M.D.   On: 05/16/2019 10:11   Dg Chest 2 View  Result Date: 05/10/2019 CLINICAL DATA:  Cough. EXAM: CHEST - 2 VIEW COMPARISON:  Jan 09, 2014 FINDINGS: Cardiomediastinal silhouette is normal. Mediastinal contours appear intact. There is no evidence of focal airspace consolidation, pleural effusion or pneumothorax. Bilateral calcified pleural plaques are noted. Osseous structures are without acute abnormality. Soft tissues are grossly normal. IMPRESSION: 1. No active cardiopulmonary disease. 2. Bilateral calcified pleural plaques. Electronically Signed   By: Fidela Salisbury M.D.   On: 05/10/2019 20:13   Ct Chest W Contrast  Result Date: 05/11/2019 CLINICAL DATA:  Acute respiratory illness, productive cough wheezing and decreased fluid and fluid intake. History of melanoma, hypotension,  asbestos exposure EXAM: CT CHEST, ABDOMEN, AND PELVIS WITH CONTRAST TECHNIQUE: Multidetector CT imaging of the chest, abdomen and pelvis was performed following the standard protocol during bolus administration of intravenous contrast. CONTRAST:  123mL OMNIPAQUE IOHEXOL 300 MG/ML  SOLN COMPARISON:  CT chest March 01, 2008 FINDINGS: CT CHEST FINDINGS Cardiovascular: Atherosclerotic plaque within the normal caliber aorta. Shared origin of the brachiocephalic and common carotid artery. Calcifications noted in the proximal great vessels. Pulmonary arteries are normal caliber. Normal heart size. No pericardial effusion. Atherosclerotic calcification of the coronary arteries. Aortic leaflet calcifications are present. Mediastinum/Nodes: There is diffuse low attenuation, circumferential thickening of the thoracic esophagus with a small sliding-type hiatal hernia. 11 mm paraesophageal lymph node is present near  the diaphragmatic hiatus (3/45). Thyroid gland and thoracic inlet are unremarkable. Trachea is free of acute abnormality. No other suspicious mediastinal, hilar or axillary lymph nodes. Lungs/Pleura: There are extensive scattered calcified pleural plaques which are similar in extent to the study from 2009. No consolidation, features of edema, pneumothorax, or effusion. No suspicious pulmonary nodules or masses. Musculoskeletal: Exaggeration of the thoracic kyphosis with multilevel degenerative changes in the imaged portions of the spine. No suspicious osseous lesions are evident. CT ABDOMEN PELVIS FINDINGS Hepatobiliary: Diffuse hepatic hypoattenuation compatible with hepatic steatosis. No focal liver abnormality is seen. Calcified gallstones layering dependently within the gallbladder. No gallbladder wall thickening. No biliary ductal dilatation or calcified intraductal gallstones. Pancreas: Atrophic appearance of the pancreas. No ductal dilatation. Spleen: Normal in size without focal abnormality. Adrenals/Urinary  Tract: Normal adrenal glands. Atrophic appearance of the kidneys with mild bilateral symmetric perinephric stranding, a nonspecific finding though may correlate with either age or decreased renal function. Few tiny subcentimeter hypoattenuating foci too small to fully characterize on CT imaging but statistically likely benign. Kidneys are otherwise unremarkable, without renal calculi, suspicious lesion, or hydronephrosis. Circumferential bladder wall thickening though may in part be due to underdistention. Stomach/Bowel: Small sliding-type hiatal hernia, as above. Distal stomach and duodenum are unremarkable. Edematous mural thickening of the small bowel. Appendix is definitively identified. No colonic wall thickening or dilatation. Vascular/Lymphatic: Atherosclerotic plaque within the normal caliber aorta. Mild ostial narrowing of the renal arteries. No aneurysm or ectasia. No suspicious or enlarged lymph nodes in the included lymphatic chains. Reproductive: The prostate and seminal vesicles are unremarkable. Other: There is a moderate volume of low-attenuation (10 HU) intraperitoneal fluid with suspicious nodular, irregular peritoneal thickening most pronounced along the inferior and posterior extent of this collection. No free air. No bowel containing hernias. Musculoskeletal: Multilevel degenerative changes are present in the imaged portions of the spine. Straightening of the normal lumbar lordosis few large Schmorl's node formations are present most pronounced at L3. 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 No acute osseous abnormality or suspicious osseous lesion. Mild dextrocurvature of the spine. IMPRESSION: 1. New moderate volume of low-attenuation intraperitoneal fluid with suspicious nodular, irregular peritoneal thickening most pronounced along the inferior and posterior extent of this collection, given the patient's history of asbestos related exposure in calcified pleural disease the  overall appearance of this finding is suspicious for malignant peritoneal mesothelioma. Additional differential consideration includes peritoneal carcinomatosis with malignant ascites. 2. Diffuse low attenuation, circumferential thickening of the thoracic esophagus with small adjacent 11 mm paraesophageal lymph node near the diaphragmatic hiatus, findings could reflect an esophagitis though given above features malignancy should be excluded. Consider further evaluation with endoscopy. 3. Circumferential bladder wall thickening though may in part be due to underdistention. Correlate with urinalysis to exclude cystitis. 4. Cholelithiasis at evidence of acute cholecystitis. 5. Aortic Atherosclerosis (ICD10-I70.0). These results were called by telephone at the time of interpretation on 05/11/2019 at 5:50 am to Dr. Merrily Pew , who verbally acknowledged these results. Electronically Signed   By: Lovena Le M.D.   On: 05/11/2019 05:50   Ct Abdomen Pelvis W Contrast  Result Date: 05/11/2019 CLINICAL DATA:  Acute respiratory illness, productive cough wheezing and decreased fluid and fluid intake. History of melanoma, hypotension, asbestos exposure EXAM: CT CHEST, ABDOMEN, AND PELVIS WITH CONTRAST TECHNIQUE: Multidetector CT imaging of the chest, abdomen and pelvis was performed following the standard protocol during bolus administration of intravenous contrast. CONTRAST:  167mL OMNIPAQUE IOHEXOL 300 MG/ML  SOLN  COMPARISON:  CT chest March 01, 2008 FINDINGS: CT CHEST FINDINGS Cardiovascular: Atherosclerotic plaque within the normal caliber aorta. Shared origin of the brachiocephalic and common carotid artery. Calcifications noted in the proximal great vessels. Pulmonary arteries are normal caliber. Normal heart size. No pericardial effusion. Atherosclerotic calcification of the coronary arteries. Aortic leaflet calcifications are present. Mediastinum/Nodes: There is diffuse low attenuation, circumferential thickening of  the thoracic esophagus with a small sliding-type hiatal hernia. 11 mm paraesophageal lymph node is present near the diaphragmatic hiatus (3/45). Thyroid gland and thoracic inlet are unremarkable. Trachea is free of acute abnormality. No other suspicious mediastinal, hilar or axillary lymph nodes. Lungs/Pleura: There are extensive scattered calcified pleural plaques which are similar in extent to the study from 2009. No consolidation, features of edema, pneumothorax, or effusion. No suspicious pulmonary nodules or masses. Musculoskeletal: Exaggeration of the thoracic kyphosis with multilevel degenerative changes in the imaged portions of the spine. No suspicious osseous lesions are evident. CT ABDOMEN PELVIS FINDINGS Hepatobiliary: Diffuse hepatic hypoattenuation compatible with hepatic steatosis. No focal liver abnormality is seen. Calcified gallstones layering dependently within the gallbladder. No gallbladder wall thickening. No biliary ductal dilatation or calcified intraductal gallstones. Pancreas: Atrophic appearance of the pancreas. No ductal dilatation. Spleen: Normal in size without focal abnormality. Adrenals/Urinary Tract: Normal adrenal glands. Atrophic appearance of the kidneys with mild bilateral symmetric perinephric stranding, a nonspecific finding though may correlate with either age or decreased renal function. Few tiny subcentimeter hypoattenuating foci too small to fully characterize on CT imaging but statistically likely benign. Kidneys are otherwise unremarkable, without renal calculi, suspicious lesion, or hydronephrosis. Circumferential bladder wall thickening though may in part be due to underdistention. Stomach/Bowel: Small sliding-type hiatal hernia, as above. Distal stomach and duodenum are unremarkable. Edematous mural thickening of the small bowel. Appendix is definitively identified. No colonic wall thickening or dilatation. Vascular/Lymphatic: Atherosclerotic plaque within the normal  caliber aorta. Mild ostial narrowing of the renal arteries. No aneurysm or ectasia. No suspicious or enlarged lymph nodes in the included lymphatic chains. Reproductive: The prostate and seminal vesicles are unremarkable. Other: There is a moderate volume of low-attenuation (10 HU) intraperitoneal fluid with suspicious nodular, irregular peritoneal thickening most pronounced along the inferior and posterior extent of this collection. No free air. No bowel containing hernias. Musculoskeletal: Multilevel degenerative changes are present in the imaged portions of the spine. Straightening of the normal lumbar lordosis few large Schmorl's node formations are present most pronounced at L3. 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 No acute osseous abnormality or suspicious osseous lesion. Mild dextrocurvature of the spine. IMPRESSION: 1. New moderate volume of low-attenuation intraperitoneal fluid with suspicious nodular, irregular peritoneal thickening most pronounced along the inferior and posterior extent of this collection, given the patient's history of asbestos related exposure in calcified pleural disease the overall appearance of this finding is suspicious for malignant peritoneal mesothelioma. Additional differential consideration includes peritoneal carcinomatosis with malignant ascites. 2. Diffuse low attenuation, circumferential thickening of the thoracic esophagus with small adjacent 11 mm paraesophageal lymph node near the diaphragmatic hiatus, findings could reflect an esophagitis though given above features malignancy should be excluded. Consider further evaluation with endoscopy. 3. Circumferential bladder wall thickening though may in part be due to underdistention. Correlate with urinalysis to exclude cystitis. 4. Cholelithiasis at evidence of acute cholecystitis. 5. Aortic Atherosclerosis (ICD10-I70.0). These results were called by telephone at the time of interpretation on 05/11/2019 at  5:50 am to Dr. Merrily Pew , who verbally acknowledged these results. Electronically  Signed   By: Lovena Le M.D.   On: 05/11/2019 05:50   Nm Pulmonary Perf And Vent  Result Date: 05/16/2019 CLINICAL DATA:  Shortness of breath, worsening effort tolerance, dyspnea, question peritoneal carcinomatosis by CT, elevated D-dimer question pulmonary embolism EXAM: NUCLEAR MEDICINE VENTILATION - PERFUSION LUNG SCAN TECHNIQUE: Ventilation images were obtained in multiple projections using inhaled aerosol Tc-60m DTPA. Perfusion images were obtained in multiple projections after intravenous injection of Tc-65m MAA. RADIOPHARMACEUTICALS:  30 mCi of Tc-63m DTPA aerosol inhalation and 1.9 mCi Tc60m MAA IV COMPARISON:  None Correlation: Chest radiograph 05/16/2019 FINDINGS: Ventilation: Diminished ventilation in RIGHT upper lobe. Central airway deposition of aerosol. No additional ventilation defects. Perfusion: Absent perfusion RIGHT upper lobe. Additional small subsegmental perfusion defect lingula which correlates to a calcified pleural plaque on CT. No additional perfusion defects. Chest radiograph: Calcified pleural plaque disease bilaterally. IMPRESSION: Small subsegmental perfusion defect in lingula corresponds to a calcified pleural plaque on CT. Absent perfusion RIGHT upper lobe with diminished though not absent ventilation in RIGHT upper lobe, ventilation better than perfusion. Solitary lobar mismatch represents a low probability for pulmonary embolism by PIOPED II criteria. Overall findings represent a low probability for pulmonary embolism. Electronically Signed   By: Lavonia Dana M.D.   On: 05/16/2019 11:44   US Paracentesis  Result Date: 05/16/2019 INDICATION: Ascites EXAM: ULTRASOUND GUIDED DIAGNOSTIC AND THERAPEUTIC PARACENTESIS MEDICATIONS: None. COMPLICATIONS: None immediate. PROCEDURE: Procedure, benefits, and risks of procedure were discussed with patient. Written informed consent for procedure was  obtained. Time out protocol followed. Adequate collection of ascites localized by ultrasound in RIGHT lower quadrant. Skin prepped and draped in usual sterile fashion. Skin and soft tissues anesthetized with 10 mL of 1% lidocaine. 5 [place] Yueh catheter placed into peritoneal cavity. 4.2 L of serosanguineous ascitic fluid aspirated by vacuum bottle suction. Procedure tolerated well by patient without immediate complication. FINDINGS: A total of approximately 4.2 L of ascitic fluid was removed. Samples were sent to the laboratory as requested by the clinical team. IMPRESSION: Successful ultrasound-guided paracentesis yielding 4.2 liters of peritoneal fluid. Electronically Signed   By: Lavonia Dana M.D.   On: 05/16/2019 12:01   Dg Abd Acute 2+v W 1v Chest  Result Date: 05/14/2019 CLINICAL DATA:  Vomiting. Constipation. EXAM: DG ABDOMEN ACUTE W/ 1V CHEST COMPARISON:  Body CT May 11, 2019 FINDINGS: Nonobstructive bowel gas pattern. Centralization of the bowels due to moderate volume ascites. Normal amount of formed stool throughout the colon. Normal cardiac silhouette. Partially calcified pleural plaques noted. Calcific atherosclerotic disease of the aorta. IMPRESSION: 1. Nonobstructive bowel gas pattern. 2. Moderate volume ascites. Electronically Signed   By: Fidela Salisbury M.D.   On: 05/14/2019 19:31     CBC Recent Labs  Lab 05/10/19 2002 05/14/19 1843 05/15/19 0414 05/16/19 1314  WBC 15.2* 16.0* 13.9* 14.7*  HGB 11.5* 10.8* 10.3* 10.2*  HCT 34.3* 33.0* 31.4* 31.6*  PLT 537* 391 358 334  MCV 88.6 89.9 91.5 91.3  MCH 29.7 29.4 30.0 29.5  MCHC 33.5 32.7 32.8 32.3  RDW 13.2 13.4 13.6 13.7    Chemistries  Recent Labs  Lab 05/10/19 2002 05/14/19 1843 05/15/19 0414 05/16/19 1314  NA 129* 131* 133* 134*  K 4.4 4.2 4.6 3.7  CL 88* 91* 96* 99  CO2 26 28 28 25   GLUCOSE 117* 121* 85 124*  BUN 28* 41* 36* 35*  CREATININE 1.38* 1.76* 1.60* 1.56*  CALCIUM 8.6* 7.6* 7.4* 7.2*  AST  --   28 24  --  ALT  --  17 14  --   ALKPHOS  --  79 69  --   BILITOT  --  0.3 0.7  --    ------------------------------------------------------------------------------------------------------------------ No results for input(s): CHOL, HDL, LDLCALC, TRIG, CHOLHDL, LDLDIRECT in the last 72 hours.  No results found for: HGBA1C ------------------------------------------------------------------------------------------------------------------ Recent Labs    05/15/19 0414  TSH 3.829   ------------------------------------------------------------------------------------------------------------------ No results for input(s): VITAMINB12, FOLATE, FERRITIN, TIBC, IRON, RETICCTPCT in the last 72 hours.  Coagulation profile No results for input(s): INR, PROTIME in the last 168 hours.  Recent Labs    05/15/19 0012  DDIMER 18.77*    Cardiac Enzymes Recent Labs  Lab 05/15/19 0012  CKMB 1.5   ------------------------------------------------------------------------------------------------------------------ No results found for: BNP   Roxan Hockey M.D on 05/17/2019 at 5:33 PM  Go to [email or web address] - for contact info  [facility] - Office  [phone]

## 2019-05-17 NOTE — Progress Notes (Signed)
BP 86/58 after NS 500 ml bolus completed. Pt asymptomatic with low BP. Reports he would prefer to have labs drawn in the am instead of this afternoon. Erasmo Downer, [place] with GI stated okay with GI if okay with Dr. Denton Brick. Discussed with Dr. Denton Brick. Stated okay for labs in the am. NS 500 ml bolus ordered again. MD aware of D50 given as ordered. Donavan Foil, RN

## 2019-05-17 NOTE — Progress Notes (Signed)
Patient BP 96/52 after NS 250 ml bolus as ordered this am. Patient reports feeling "sleepy and weak". Denies dizziness or lightheadness with low BP. Text-paged Dr. Denton Brick to notify. Donavan Foil, RN

## 2019-05-17 NOTE — Consult Note (Signed)
[facility] Oncology Progress Note  Name: Thomas [name]      MRN: [id number]    Location: A303/A303-01  Date: 05/17/2019 Time:7:54 AM   Subjective: Interval History:Thomas [name] he is lying in bed sleeping.  He had abdominal paracentesis with 4.2 L of serosanguineous fluid drained yesterday.  He felt better with regards to abdominal distention.  He scheduled for EGD this morning.  No temperatures overnight noted.  Denies any chest pains or abdominal pains.  Denies any nausea or vomiting at this time.  Objective: Vital signs in last 24 hours: Temp:  [97.2 F (36.2 C)-98.1 F (36.7 C)] 98.1 F (36.7 C) (09/09 0528) Pulse Rate:  [79-88] 79 (09/09 0528) Resp:  [16-18] 17 (09/09 0528) BP: (84-105)/(46-70) 84/47 (09/09 0528) SpO2:  [95 %-100 %] 96 % (09/09 0528) Weight:  [171 lb 11.8 oz (77.9 kg)] 171 lb 11.8 oz (77.9 kg) (09/09 0500)    Intake/Output from previous day: 09/08 0800 - 09/09 0759 In: 1230 [P.O.:480; I.V.:750] Out: -     Intake/Output this shift: Total I/O In: 1230 [P.O.:480; I.V.:750] Out: -    PHYSICAL EXAM: BP (!) 84/47 (BP Location: Right Arm)   Pulse 79   Temp 98.1 F (36.7 C) (Oral)   Resp 17   Ht 5\' 9"  (1.753 m)   Wt 171 lb 11.8 oz (77.9 kg)   SpO2 96%   BMI 25.36 kg/m  General appearance: alert, cooperative and appears stated age Lungs: clear to auscultation bilaterally Heart: regular rate and rhythm Abdomen: soft, non-tender; bowel sounds normal; no masses,  no organomegaly Extremities: extremities normal, atraumatic, no cyanosis or edema Skin: Skin color, texture, turgor normal. No rashes or lesions Lymph nodes: Cervical, supraclavicular, and axillary nodes normal. Neurologic: Grossly normal   Studies/Results: Results for orders placed or performed during the hospital encounter of 05/14/19 (from the past 48 hour(s))  Amylase, pleural or peritoneal fluid     Status: None   Collection Time: 05/16/19 11:25 AM  Result Value Ref  Range   Amylase, Fluid 15 U/L   Fluid Type-FAMY ASCITIES     Comment: Performed at [facility], [address], [place], [place] [address]  Lactate dehydrogenase (pleural or peritoneal fluid)     Status: Abnormal   Collection Time: 05/16/19 11:25 AM  Result Value Ref Range   LD, Fluid 1,633 (H) 3 - 23 U/L    Comment: (NOTE) Results should be evaluated in conjunction with serum values    Fluid Type-FLDH ASCITIES     Comment: Performed at [facility], [address], [place], [place] [address]  Protein, pleural or peritoneal fluid     Status: None   Collection Time: 05/16/19 11:25 AM  Result Value Ref Range   Total protein, fluid 3.1 g/dL    Comment: (NOTE) No normal range established for this test Results should be evaluated in conjunction with serum values    Fluid Type-FTP ASCITIES     Comment: Performed at [facility], [address] [place], [place] [address]  Body fluid cell count with differential     Status: Abnormal   Collection Time: 05/16/19 11:25 AM  Result Value Ref Range   Fluid Type-FCT Body Fluid    Color, Fluid AMBER (A) YELLOW   Appearance, Fluid CLOUDY (A) CLEAR   Total Nucleated Cell Count, Fluid 2,137 (H) 0 - 1,000 cu mm   Neutrophil Count, Fluid 5 0 - 25 %   Lymphs, Fluid 90 %   Monocyte-Macrophage-Serous Fluid  5 (L) 50 - 90 %   Eos, Fluid 0 %    Comment: Performed at [facility], [address], [place], [place] [address]  Culture, body fluid-bottle     Status: None (Preliminary result)   Collection Time: 05/16/19 11:25 AM   Specimen: Ascitic  Result Value Ref Range   Specimen Description ASCITIC    Special Requests 10CC BOTTLES DRAWN AEROBIC AND ANAEROBIC    Culture      NO GROWTH < 24 HOURS Performed at [facility], [address] [place], [place] [address]    Report Status PENDING   Gram stain     Status: None   Collection Time: 05/16/19 11:25 AM   Specimen: Ascitic  Result Value Ref Range   Specimen Description ASCITIC    Special Requests  NONE    Gram Stain      CYTOSPIN SMEAR ASCITIC FLUID WBC PRESENT, PREDOMINANTLY MONONUCLEAR NO ORGANISMS SEEN [facility] Performed at [facility], [address], [place], [place] [address]    Report Status 05/16/2019 FINAL   CBC     Status: Abnormal   Collection Time: 05/16/19  1:14 PM  Result Value Ref Range   WBC 14.7 (H) 4.0 - 10.5 K/uL   RBC 3.46 (L) 4.22 - 5.81 MIL/uL   Hemoglobin 10.2 (L) 13.0 - 17.0 g/dL   HCT 31.6 (L) 39.0 - 52.0 %   MCV 91.3 80.0 - 100.0 fL   MCH 29.5 26.0 - 34.0 pg   MCHC 32.3 30.0 - 36.0 g/dL   RDW 13.7 11.5 - 15.5 %   Platelets 334 150 - 400 K/uL   nRBC 0.0 0.0 - 0.2 %    Comment: Performed at [facility], [address], [place], [place] [id number]  Basic metabolic panel     Status: Abnormal   Collection Time: 05/16/19  1:14 PM  Result Value Ref Range   Sodium 134 (L) 135 - 145 mmol/L   Potassium 3.7 3.5 - 5.1 mmol/L    Comment: DELTA CHECK NOTED   Chloride 99 98 - 111 mmol/L   CO2 25 22 - 32 mmol/L   Glucose, Bld 124 (H) 70 - 99 mg/dL   BUN 35 (H) 8 - 23 mg/dL   Creatinine, Ser 1.56 (H) 0.61 - 1.24 mg/dL   Calcium 7.2 (L) 8.9 - 10.3 mg/dL   GFR calc non Af Amer 39 (L) >60 mL/min   GFR calc Af Amer 46 (L) >60 mL/min   Anion gap 10 5 - 15    Comment: Performed at [facility], [address], [place], [place] [address]   Dg Chest 2 View  Result Date: 05/16/2019 CLINICAL DATA:  85 year old male with shortness of breath. EXAM: CHEST - 2 VIEW COMPARISON:  CT Chest, Abdomen, and Pelvis 05/11/2019, and earlier. FINDINGS: Calcified pleural plaques in both lungs. Mediastinal contours remain normal. Visualized tracheal air column is within normal limits. No pneumothorax, pulmonary edema, pleural effusion or acute pulmonary opacity. Calcified aortic atherosclerosis. No acute osseous abnormality identified. Paucity of bowel gas in the upper abdomen. IMPRESSION: Stable calcified pleural plaques. No acute cardiopulmonary abnormality. Electronically  Signed   By: Genevie Ann M.D.   On: 05/16/2019 10:11   Nm Pulmonary Perf And Vent  Result Date: 05/16/2019 CLINICAL DATA:  Shortness of breath, worsening effort tolerance, dyspnea, question peritoneal carcinomatosis by CT, elevated D-dimer question pulmonary embolism EXAM: NUCLEAR MEDICINE VENTILATION - PERFUSION LUNG SCAN TECHNIQUE: Ventilation images were obtained in multiple projections using inhaled  aerosol Tc-96m DTPA. Perfusion images were obtained in multiple projections after intravenous injection of Tc-45m MAA. RADIOPHARMACEUTICALS:  30 mCi of Tc-64m DTPA aerosol inhalation and 1.9 mCi Tc1m MAA IV COMPARISON:  None Correlation: Chest radiograph 05/16/2019 FINDINGS: Ventilation: Diminished ventilation in RIGHT upper lobe. Central airway deposition of aerosol. No additional ventilation defects. Perfusion: Absent perfusion RIGHT upper lobe. Additional small subsegmental perfusion defect lingula which correlates to a calcified pleural plaque on CT. No additional perfusion defects. Chest radiograph: Calcified pleural plaque disease bilaterally. IMPRESSION: Small subsegmental perfusion defect in lingula corresponds to a calcified pleural plaque on CT. Absent perfusion RIGHT upper lobe with diminished though not absent ventilation in RIGHT upper lobe, ventilation better than perfusion. Solitary lobar mismatch represents a low probability for pulmonary embolism by PIOPED II criteria. Overall findings represent a low probability for pulmonary embolism. Electronically Signed   By: Lavonia Dana M.D.   On: 05/16/2019 11:44   US Paracentesis  Result Date: 05/16/2019 INDICATION: Ascites EXAM: ULTRASOUND GUIDED DIAGNOSTIC AND THERAPEUTIC PARACENTESIS MEDICATIONS: None. COMPLICATIONS: None immediate. PROCEDURE: Procedure, benefits, and risks of procedure were discussed with patient. Written informed consent for procedure was obtained. Time out protocol followed. Adequate collection of ascites localized by ultrasound in  RIGHT lower quadrant. Skin prepped and draped in usual sterile fashion. Skin and soft tissues anesthetized with 10 mL of 1% lidocaine. 5 [place] Yueh catheter placed into peritoneal cavity. 4.2 L of serosanguineous ascitic fluid aspirated by vacuum bottle suction. Procedure tolerated well by patient without immediate complication. FINDINGS: A total of approximately 4.2 L of ascitic fluid was removed. Samples were sent to the laboratory as requested by the clinical team. IMPRESSION: Successful ultrasound-guided paracentesis yielding 4.2 liters of peritoneal fluid. Electronically Signed   By: Lavonia Dana M.D.   On: 05/16/2019 12:01     MEDICATIONS: I have reviewed the patient's current medications.     Assessment/Plan:  1.  Ascites: - Status post paracentesis on 05/16/2019 with 4.2 L of serosanguineous fluid removed. -Cytology is pending at this time.  Cell count shows predominantly lymphocytes with elevated fluid LDH.  Albumin was not done. - Gram stain was also negative.  CT of the abdomen and pelvis on 05/11/2019 showed irregular peritoneal thickening, which needs to be biopsied if cytology is inconclusive.  2.  Esophageal thickening: - CT scan on 05/11/2019 demonstrated thickening of the thoracic esophagus with small adjacent 11 mm paraesophageal lymph node near the diaphragmatic hiatus. -He has been n.p.o. overnight and is scheduled for EGD and possible biopsy today.  3.  Normocytic anemia: - Hemoglobin is 10.2 with MCV of 91.3. -Would recommend checking ferritin, iron panel, B12, methylmalonic acid, folic acid, copper and SPEP. -He has CKD which could also be contributing to his anemia.  All questions were answered. The patient knows to call the clinic with any problems, questions or concerns. We can certainly see the patient much sooner if necessary.     Derek Jack

## 2019-05-17 NOTE — Progress Notes (Signed)
Subjective: States he is doing fine. No abdominal pain, nausea, or vomiting. No BM today. Is doing well s/p paracentesis yesterday. Do recurrence of abdominal distension at this time. He is frustrated as we are having to reschedule his EGD for tomorrow in light of his low blood pressure but understands the reasoning. Denies lightheadedness, dizziness, or symptoms of pre-syncope. Denies shortness of breath, cough, chest pain, palpitations, or urinary symptoms.   Objective: Vital signs in last 24 hours: Temp:  [97.6 F (36.4 C)-98.1 F (36.7 C)] 97.9 F (36.6 C) (09/09 1313) Pulse Rate:  [73-79] 73 (09/09 1320) Resp:  [17-19] 19 (09/09 1313) BP: (82-96)/(47-60) 88/53 (09/09 1452) SpO2:  [95 %-98 %] 97 % (09/09 1313) Weight:  [77.9 kg] 77.9 kg (09/09 0500) Last BM Date: 05/16/19 General:   Alert and oriented, pleasant, in no acute distress.  Heart:  S1, S2 present, no murmurs noted.  Lungs: Clear to auscultation bilaterally, without wheezing, rales, or rhonchi.  Abdomen:  Bowel sounds present, soft, non-tender, non-distended. No HSM or hernias noted. No rebound or guarding. No masses appreciated Band-Aid remains in place from paracentesis yesterday. No erythema or pain at site.  Extremities:  Without clubbing or edema. Neurologic:  Alert and  oriented x4;  grossly normal neurologically. Skin:  Intact without significant lesions.  Psych: Normal mood and affect.  Intake/Output from previous day: 09/08 0701 - 09/09 0700 In: 1230 [P.O.:480; I.V.:750] Out: -  Intake/Output this shift: Total I/O In: 250 [IV Piggyback:250] Out: 350 [Urine:350]  Lab Results: Recent Labs    05/14/19 1843 05/15/19 0414 05/16/19 1314  WBC 16.0* 13.9* 14.7*  HGB 10.8* 10.3* 10.2*  HCT 33.0* 31.4* 31.6*  PLT 391 358 334   BMET Recent Labs    05/14/19 1843 05/15/19 0414 05/16/19 1314  NA 131* 133* 134*  K 4.2 4.6 3.7  CL 91* 96* 99  CO2 28 28 25   GLUCOSE 121* 85 124*  BUN 41* 36* 35*   CREATININE 1.76* 1.60* 1.56*  CALCIUM 7.6* 7.4* 7.2*   LFT Recent Labs    05/14/19 1843 05/15/19 0414  PROT 6.1* 5.2*  ALBUMIN 2.1* 1.9*  AST 28 24  ALT 17 14  ALKPHOS 79 69  BILITOT 0.3 0.7    Studies/Results: Dg Chest 2 View  Result Date: 05/16/2019 CLINICAL DATA:  85 year old male with shortness of breath. EXAM: CHEST - 2 VIEW COMPARISON:  CT Chest, Abdomen, and Pelvis 05/11/2019, and earlier. FINDINGS: Calcified pleural plaques in both lungs. Mediastinal contours remain normal. Visualized tracheal air column is within normal limits. No pneumothorax, pulmonary edema, pleural effusion or acute pulmonary opacity. Calcified aortic atherosclerosis. No acute osseous abnormality identified. Paucity of bowel gas in the upper abdomen. IMPRESSION: Stable calcified pleural plaques. No acute cardiopulmonary abnormality. Electronically Signed   By: Genevie Ann M.D.   On: 05/16/2019 10:11   Nm Pulmonary Perf And Vent  Result Date: 05/16/2019 CLINICAL DATA:  Shortness of breath, worsening effort tolerance, dyspnea, question peritoneal carcinomatosis by CT, elevated D-dimer question pulmonary embolism EXAM: NUCLEAR MEDICINE VENTILATION - PERFUSION LUNG SCAN TECHNIQUE: Ventilation images were obtained in multiple projections using inhaled aerosol Tc-12m DTPA. Perfusion images were obtained in multiple projections after intravenous injection of Tc-54m MAA. RADIOPHARMACEUTICALS:  30 mCi of Tc-53m DTPA aerosol inhalation and 1.9 mCi Tc74m MAA IV COMPARISON:  None Correlation: Chest radiograph 05/16/2019 FINDINGS: Ventilation: Diminished ventilation in RIGHT upper lobe. Central airway deposition of aerosol. No additional ventilation defects. Perfusion: Absent perfusion RIGHT upper lobe. Additional small subsegmental  perfusion defect lingula which correlates to a calcified pleural plaque on CT. No additional perfusion defects. Chest radiograph: Calcified pleural plaque disease bilaterally. IMPRESSION: Small  subsegmental perfusion defect in lingula corresponds to a calcified pleural plaque on CT. Absent perfusion RIGHT upper lobe with diminished though not absent ventilation in RIGHT upper lobe, ventilation better than perfusion. Solitary lobar mismatch represents a low probability for pulmonary embolism by PIOPED II criteria. Overall findings represent a low probability for pulmonary embolism. Electronically Signed   By: Lavonia Dana M.D.   On: 05/16/2019 11:44   US Paracentesis  Result Date: 05/16/2019 INDICATION: Ascites EXAM: ULTRASOUND GUIDED DIAGNOSTIC AND THERAPEUTIC PARACENTESIS MEDICATIONS: None. COMPLICATIONS: None immediate. PROCEDURE: Procedure, benefits, and risks of procedure were discussed with patient. Written informed consent for procedure was obtained. Time out protocol followed. Adequate collection of ascites localized by ultrasound in RIGHT lower quadrant. Skin prepped and draped in usual sterile fashion. Skin and soft tissues anesthetized with 10 mL of 1% lidocaine. 5 [place] Yueh catheter placed into peritoneal cavity. 4.2 L of serosanguineous ascitic fluid aspirated by vacuum bottle suction. Procedure tolerated well by patient without immediate complication. FINDINGS: A total of approximately 4.2 L of ascitic fluid was removed. Samples were sent to the laboratory as requested by the clinical team. IMPRESSION: Successful ultrasound-guided paracentesis yielding 4.2 liters of peritoneal fluid. Electronically Signed   By: Lavonia Dana M.D.   On: 05/16/2019 12:01    Assessment: 85 y.o. year old male with HTN, HLD, vitamin D deficiency, reported history of elevated Psa, h/o melanoma, and new diagnosis of mesothelioma with ascites on 05/11/19 via CT who presented to the ED on 05/14/19 with c/o nausea, vomiting, and generalized weakness over the last 3-4 weeks. Also with abdominal distention. In the ED, abdominal Xray with nonobstructive bowel gas pattern and moderate volume ascites. Labs with WBC 16,  hemoglobin 10.8 with normocytic indices, HCT 33.0, Na 131, K 4.2, BUN 41, Cr 1.76. Lipase normal. EKG with nsr at 100, ST depression in v4-v6. Troponin 38. ? Demand ischemia vs secondary to ARF/cardiac murmur. Patient was admitted for n/v, hyponatremia, dehydration, and acute renal failure and was started on IV fluids, Zofran PRN, and Protonix BID.   N/V: Patient is without any nausea or vomiting today as he has been NPO, but has had nausea with vomiting occurring after meals for the last 2 months. Also with pain in his mid chest when swallowing. Denies foods getting hung in esophagus. Denies hematemesis, GERD symptoms, abdominal pain, hematochezia, melena, or NSAID use. Used to drink a couple alcoholic beverages a night, but none in the last 2 months. CT on 05/11/19 with diffuse low attenuation, circumferential thickening of the thoracic esophagus with small adjacent 11 mm paraoesophageal lymph node and edematous mural thickening of the small bowel. Patient has also had new onset of anemia first noted in August 2020, but indices are normocytic and hemoglobin is stable at this time.   Suspect symptoms are likely related to malignancy. Mesothelioma with likely malignant ascites was diagnosed recently, but query whether patient has esophageal malignancy with findings on CT and no significant GERD symptoms or NSAID use. This thickening is likely contributing to his reported odynophagia. Patient was scheduled for EGD today, 05/17/19, for further evaluation. However, patients blood pressure has been low today and persisted after 2 fluid boluses (250 ml followed by 500 ml). He is scheduled to receive another 500 ml bolus. We will plan to pursue EGD with propofol tomorrow.   Ascites: Likely related  to malignancy. No ascites on recent CT. Patient had diagnostic paracentesis on 05/16/19 with removal of 4.2 L of fluid. No SBP. Cytology with atypical cells likely insufficient material for additional studies. Dr. Delton Coombes is  following.   Plan: Full liquid diet for the remainder of today NPO at midnight except for sips with meds.  EGD on 9/10 with propofol with Dr. Oneida Alar Continue Protonix 40 mg BID Continue Zofran Continue supportive measures   LOS: 2 days    05/17/2019, 3:08 PM   Aliene Altes, [facility]

## 2019-05-18 ENCOUNTER — Inpatient Hospital Stay (HOSPITAL_COMMUNITY): Payer: Medicare Other | Admitting: Anesthesiology

## 2019-05-18 ENCOUNTER — Encounter (HOSPITAL_COMMUNITY)
Admission: EM | Disposition: A | Discharge status: Home / Self Care | Payer: Self-pay | Source: Home / Self Care | Attending: Family Medicine

## 2019-05-18 ENCOUNTER — Telehealth: Payer: Self-pay | Admitting: Oncology

## 2019-05-18 ENCOUNTER — Encounter (HOSPITAL_COMMUNITY): Payer: Self-pay | Admitting: *Deleted

## 2019-05-18 LAB — CBC
HCT: 30.7 % — ABNORMAL LOW (ref 39.0–52.0)
Hemoglobin: 9.9 g/dL — ABNORMAL LOW (ref 13.0–17.0)
MCH: 29.4 pg (ref 26.0–34.0)
MCHC: 32.2 g/dL (ref 30.0–36.0)
MCV: 91.1 fL (ref 80.0–100.0)
Platelets: 290 10*3/uL (ref 150–400)
RBC: 3.37 MIL/uL — ABNORMAL LOW (ref 4.22–5.81)
RDW: 14.1 % (ref 11.5–15.5)
WBC: 15.2 10*3/uL — ABNORMAL HIGH (ref 4.0–10.5)
nRBC: 0 % (ref 0.0–0.2)

## 2019-05-18 LAB — BASIC METABOLIC PANEL
Anion gap: 6 (ref 5–15)
BUN: 22 mg/dL (ref 8–23)
CO2: 24 mmol/L (ref 22–32)
Calcium: 6.2 mg/dL — CL (ref 8.9–10.3)
Chloride: 104 mmol/L (ref 98–111)
Creatinine, Ser: 1.24 mg/dL (ref 0.61–1.24)
GFR calc Af Amer: >60 mL/min (ref >60)
GFR calc non Af Amer: 52 mL/min — ABNORMAL LOW (ref >60)
Glucose, Bld: 101 mg/dL — ABNORMAL HIGH (ref 70–99)
Potassium: 4 mmol/L (ref 3.5–5.1)
Sodium: 134 mmol/L — ABNORMAL LOW (ref 135–145)

## 2019-05-18 SURGERY — ESOPHAGOGASTRODUODENOSCOPY (EGD) WITH PROPOFOL
Anesthesia: Monitor Anesthesia Care

## 2019-05-18 MED ORDER — PANTOPRAZOLE SODIUM 40 MG PO TBEC: 40.0000 mg | DELAYED_RELEASE_TABLET | Freq: Every day | ORAL | 2 refills | Status: AC

## 2019-05-18 MED ORDER — ONDANSETRON 4 MG PO TBDP: 4.0000 mg | ORAL_TABLET | Freq: Three times a day (TID) | ORAL | 3 refills | Status: AC | PRN

## 2019-05-18 MED ORDER — ACETAMINOPHEN 325 MG PO TABS: 650.0000 mg | ORAL_TABLET | Freq: Four times a day (QID) | ORAL | 0 refills | Status: AC | PRN

## 2019-05-18 MED ORDER — SODIUM CHLORIDE 0.9 % IV SOLN: INTRAVENOUS | Status: DC

## 2019-05-18 MED ORDER — PROPOFOL 10 MG/ML IV BOLUS
INTRAVENOUS | Status: AC
  Filled 2019-05-18: qty 40

## 2019-05-18 MED ORDER — LACTATED RINGERS IV SOLN
INTRAVENOUS | Status: DC
  Administered 2019-05-18: 1000 mL via INTRAVENOUS

## 2019-05-18 NOTE — Discharge Summary (Signed)
Thomas [name], is a 85 y.o. male  DOB May 19, 1930  MRN [id number].  Admission date:  05/14/2019  Admitting Physician  Jani Gravel, MD  Discharge Date:  05/18/2019   Primary MD  Susy Frizzle, MD  Recommendations for primary care physician for things to follow:   1) we suspect that you most likely have cancer--esophageal (food pipe) cancer OR mesothelioma (cancer) in your belly--- you have decided for now not to have diagnostic biopsy to confirm this suspicion of cancer-- --if you change your mind and you would like to undergo endoscopy/EGD with biopsy---OR image guided biopsy of the possible cancer spots in your belly--- please let your oncologist Dr. Delton Coombes or your primary care physician know and I can arrange for diagnostic biopsy to be done 2) you may need paracentesis (removal of fluid from your belly) time to time if the fluid in your belly gets too much and you uncomfortable 3) nutritional supplements (ensure or boost) and multivitamin advised 4) please hold your blood pressure medications as her blood pressure has been running low  Admission Diagnosis  Dehydration [E86.0] Intractable vomiting with nausea, unspecified vomiting type [R11.2]   Discharge Diagnosis  Dehydration [E86.0] Intractable vomiting with nausea, unspecified vomiting type [R11.2]   Principal Problem:   ARF (acute renal failure) ([place]) Active Problems:   Essential hypertension   Hyponatremia   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)   Protein-calorie malnutrition, severe ([place])   Elevated troponin   Esophageal thickening   Nausea and vomiting   Malignant ascites   Normocytic anemia   Ascites      Past Medical History:  Diagnosis Date   ANEMIA 02/08/2008   ARTHRITIS 02/08/2008   Cataract    left eye   CONTRACTURE OF TENDON 02/08/2008   ELEVATED PROSTATE SPECIFIC ANTIGEN 02/08/2008   GLAUCOMA, LEFT EYE 02/07/2009   left eye   HISTORY  OF ASBESTOS EXPOSURE 02/08/2008   HYPERLIPIDEMIA 02/08/2008   Hypertension    HYPOTENSION 02/18/2010   HYPOTHYROIDISM 02/08/2008   Melanoma ([place])    left shoulder   Mesothelioma ([place]) 05/11/2019   on CT   VITAMIN D DEFICIENCY 02/07/2009    Past Surgical History:  Procedure Laterality Date   dupyreenes contractures-bilat     SKIN CANCER EXCISION         HPI  from the history and physical done on the day of admission:     -Thomas [name]  is a 85 y.o. male, w hypertension, hyperlipidemia, anemia, vitamin D deficiency,  elevated Psa (could not confirm , labs in Epic normal),  h/o melanoma, recent new diagnosis of mesothelioma w ascites , apparently presents with c/o nausea , emesis and weakness over the past 3-4weeks.  Pt denies fever, chills, cough, cp, palp, sob, abd pain, diarrhea, brbpr, dysuria, hematuria.   In ED,  T 98 P 102, R 18, Bp 99/56  Pox 97% on RA Wt 77.6 kg  Xray Abdomen,  IMPRESSION: 1. Nonobstructive bowel gas pattern. 2. Moderate volume ascites.  Wbc 16.0, Hgb 10.8, Plt 391 Na  131, K 4.2, Bun 41, Creatinine 1.76 Alb 2.1 Ast 28, Alt 17, Alk phos 79, T. Bili 0.3  Urinalysis pending.    EKG nsr at 100, nl axis, st depression in v4-6 Trop 38  Pt will be admitted for n/v, and dehydration and acute renal failure.     Hospital Course:   Brief Summary 85 y.o.male,w hypertension, hyperlipidemia, anemia, vitamin D deficiency, elevated Psa (could not confirm , labs in Epic normal), h/o melanoma, recent new diagnosis of possible esophageal carcinoma versus mesothelioma w ascites, emesis and abdominal discomfort  A/p 1)Recurrent emesis--- abdominal imaging without obstructive findings, -EGD postponed until 05/18/2019 due to hypotension - -patient and daughter declined EGD on 19 2020 after discussion of risk versus benefit with anesthesiologist -  2) presumed malignant ascites--- status post diagnostic paracentesis- gram stain and culture  NGTD -Paracentesis on 05/16/2019 with 4.2 L of serosanguineous fluid removed, -Cytology is with atypical cells/inconclusive - CEA level pending. -  Patient and daughter declined EGD and possible diagnostic biopsy of suspicious lesion at the esophageal thickening. - Oncologist recommends  biopsy of the peritoneal nodules if the cytology is inconclusive--patient and her daughter declines further diagnostic biopsy at this time -Peritoneal fluid cell count is > 2100.Marland Kitchen Suspect inflammatory (/reactive) rather than Rasheed SBP -Peritoneal fluid LDH is 1633  3)Elevated d-dimer--may be related to underlying malignancy/mesothelioma--- unable to do CTA chest for PE rule out due to elevated creatinine,   --VQ scan low probability for PE  4)HTN-discontinue BP meds as BP has been soft  5)AKI----acute kidney injury on CKD stage - III     creatinine on admission= 1.76  ,   baseline creatinine =  1.2 to 1.3   , creatinine is now= 1.2  , renally adjust medications, avoid nephrotoxic agents/dehydration/hypotension  6)FEN/dysphagia/--difficulty with oral intake, calcium is 6.2, corrected for low albumin of 1.9 calcium is actually 7.8.  -Liquid/soft diet advised -patient and daughter declined EGD on 19 2020 after discussion of risk versus benefit with anesthesiologist  7)Social/Ethics--patient and daughter declines  diagnostic biopsy at this time, they declined palliative care and hospice involvement at this time--- they want to have time to talk things over as a family -Patient requesting discharge home today -He will talk with his family and make decisions regarding pursuing further diagnostic and treatment options Or going with palliative care and hospice-- --when he decides on whether he wants to go with palliative / hospice service or pursue further aggressive work-up and treatment he will notify his PCP and Dr. Delton Coombes  Code Status : Full  Family Communication:    (patient is alert, awake and  coherent) -Discussed with daughter Arbie Cookey at bedside  Disposition Plan  :  Home at this time  Consults  :  Oncology/radiology/Gi  Discharge Condition: stable  Follow UP--- PCP and Dr. Delton Coombes after deciding on more aggressive work-up and treatment Versus Palliative/hospice  Diet and Activity recommendation:  As advised  Discharge Instructions    Discharge Instructions    Call MD for:  difficulty breathing, headache or visual disturbances   Complete by: As directed    Call MD for:  persistant dizziness or light-headedness   Complete by: As directed    Call MD for:  persistant nausea and vomiting   Complete by: As directed    Call MD for:  severe uncontrolled pain   Complete by: As directed    Call MD for:  temperature >100.4   Complete by: As directed    Diet general  Complete by: As directed    Soft diet   Discharge instructions   Complete by: As directed    1) we suspect that you most likely have cancer--esophageal (food pipe) cancer OR mesothelioma (cancer) in your belly--- you have decided for now not to have diagnostic biopsy to confirm this suspicion of cancer-- --if you change your mind and you would like to undergo endoscopy/EGD with biopsy---OR image guided biopsy of the possible cancer spots in your belly--- please let your oncologist Dr. Delton Coombes or your primary care physician know and I can arrange for diagnostic biopsy to be done 2) you may need paracentesis (removal of fluid from your belly) time to time if the fluid in your belly gets too much and you uncomfortable 3) nutritional supplements (ensure or boost) and multivitamin advised 4) please hold your blood pressure medications as her blood pressure has been running low   Increase activity slowly   Complete by: As directed         Discharge Medications     Allergies as of 05/18/2019      Reactions   Penicillins Rash   REACTION: rash      Medication List    STOP taking these medications    amLODipine 5 MG tablet Commonly known as: NORVASC   atenolol-chlorthalidone 50-25 MG tablet Commonly known as: TENORETIC     TAKE these medications   acetaminophen 325 MG tablet Commonly known as: TYLENOL Take 2 tablets (650 mg total) by mouth every 6 (six) hours as needed for mild pain (or Fever >/= 101).   Eye Vitamins Caps Take 1 capsule by mouth daily.   ondansetron 4 MG disintegrating tablet Commonly known as: Zofran ODT Take 1 tablet (4 mg total) by mouth every 8 (eight) hours as needed for nausea or vomiting. 67m ODT q4 hours prn nausea/vomit What changed: reasons to take this   pantoprazole 40 MG tablet Commonly known as: PROTONIX Take 1 tablet (40 mg total) by mouth daily. What changed:   medication strength  how much to take   Xalatan 0.005 % ophthalmic solution Generic drug: latanoprost Place 1 drop into both eyes at bedtime.       Major procedures and Radiology Reports - PLEASE review detailed and final reports for all details, in brief -  Dg Chest 2 View  Result Date: 05/16/2019 CLINICAL DATA:  85year old male with shortness of breath. EXAM: CHEST - 2 VIEW COMPARISON:  CT Chest, Abdomen, and Pelvis 05/11/2019, and earlier. FINDINGS: Calcified pleural plaques in both lungs. Mediastinal contours remain normal. Visualized tracheal air column is within normal limits. No pneumothorax, pulmonary edema, pleural effusion or acute pulmonary opacity. Calcified aortic atherosclerosis. No acute osseous abnormality identified. Paucity of bowel gas in the upper abdomen. IMPRESSION: Stable calcified pleural plaques. No acute cardiopulmonary abnormality. Electronically Signed   By: HGenevie AnnM.D.   On: 05/16/2019 10:11   Dg Chest 2 View  Result Date: 05/10/2019 CLINICAL DATA:  Cough. EXAM: CHEST - 2 VIEW COMPARISON:  Jan 09, 2014 FINDINGS: Cardiomediastinal silhouette is normal. Mediastinal contours appear intact. There is no evidence of focal airspace consolidation, pleural  effusion or pneumothorax. Bilateral calcified pleural plaques are noted. Osseous structures are without acute abnormality. Soft tissues are grossly normal. IMPRESSION: 1. No active cardiopulmonary disease. 2. Bilateral calcified pleural plaques. Electronically Signed   By: DFidela SalisburyM.D.   On: 05/10/2019 20:13   Ct Chest W Contrast  Result Date: 05/11/2019 CLINICAL DATA:  Acute respiratory illness, productive  cough wheezing and decreased fluid and fluid intake. History of melanoma, hypotension, asbestos exposure EXAM: CT CHEST, ABDOMEN, AND PELVIS WITH CONTRAST TECHNIQUE: Multidetector CT imaging of the chest, abdomen and pelvis was performed following the standard protocol during bolus administration of intravenous contrast. CONTRAST:  162m OMNIPAQUE IOHEXOL 300 MG/ML  SOLN COMPARISON:  CT chest March 01, 2008 FINDINGS: CT CHEST FINDINGS Cardiovascular: Atherosclerotic plaque within the normal caliber aorta. Shared origin of the brachiocephalic and common carotid artery. Calcifications noted in the proximal great vessels. Pulmonary arteries are normal caliber. Normal heart size. No pericardial effusion. Atherosclerotic calcification of the coronary arteries. Aortic leaflet calcifications are present. Mediastinum/Nodes: There is diffuse low attenuation, circumferential thickening of the thoracic esophagus with a small sliding-type hiatal hernia. 11 mm paraesophageal lymph node is present near the diaphragmatic hiatus (3/45). Thyroid gland and thoracic inlet are unremarkable. Trachea is free of acute abnormality. No other suspicious mediastinal, hilar or axillary lymph nodes. Lungs/Pleura: There are extensive scattered calcified pleural plaques which are similar in extent to the study from 2009. No consolidation, features of edema, pneumothorax, or effusion. No suspicious pulmonary nodules or masses. Musculoskeletal: Exaggeration of the thoracic kyphosis with multilevel degenerative changes in the imaged  portions of the spine. No suspicious osseous lesions are evident. CT ABDOMEN PELVIS FINDINGS Hepatobiliary: Diffuse hepatic hypoattenuation compatible with hepatic steatosis. No focal liver abnormality is seen. Calcified gallstones layering dependently within the gallbladder. No gallbladder wall thickening. No biliary ductal dilatation or calcified intraductal gallstones. Pancreas: Atrophic appearance of the pancreas. No ductal dilatation. Spleen: Normal in size without focal abnormality. Adrenals/Urinary Tract: Normal adrenal glands. Atrophic appearance of the kidneys with mild bilateral symmetric perinephric stranding, a nonspecific finding though may correlate with either age or decreased renal function. Few tiny subcentimeter hypoattenuating foci too small to fully characterize on CT imaging but statistically likely benign. Kidneys are otherwise unremarkable, without renal calculi, suspicious lesion, or hydronephrosis. Circumferential bladder wall thickening though may in part be due to underdistention. Stomach/Bowel: Small sliding-type hiatal hernia, as above. Distal stomach and duodenum are unremarkable. Edematous mural thickening of the small bowel. Appendix is definitively identified. No colonic wall thickening or dilatation. Vascular/Lymphatic: Atherosclerotic plaque within the normal caliber aorta. Mild ostial narrowing of the renal arteries. No aneurysm or ectasia. No suspicious or enlarged lymph nodes in the included lymphatic chains. Reproductive: The prostate and seminal vesicles are unremarkable. Other: There is a moderate volume of low-attenuation (10 HU) intraperitoneal fluid with suspicious nodular, irregular peritoneal thickening most pronounced along the inferior and posterior extent of this collection. No free air. No bowel containing hernias. Musculoskeletal: Multilevel degenerative changes are present in the imaged portions of the spine. Straightening of the normal lumbar lordosis few large  Schmorl's node formations are present most pronounced at L3. 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 No acute osseous abnormality or suspicious osseous lesion. Mild dextrocurvature of the spine. IMPRESSION: 1. New moderate volume of low-attenuation intraperitoneal fluid with suspicious nodular, irregular peritoneal thickening most pronounced along the inferior and posterior extent of this collection, given the patient's history of asbestos related exposure in calcified pleural disease the overall appearance of this finding is suspicious for malignant peritoneal mesothelioma. Additional differential consideration includes peritoneal carcinomatosis with malignant ascites. 2. Diffuse low attenuation, circumferential thickening of the thoracic esophagus with small adjacent 11 mm paraesophageal lymph node near the diaphragmatic hiatus, findings could reflect an esophagitis though given above features malignancy should be excluded. Consider further evaluation with endoscopy. 3. Circumferential bladder wall thickening  though may in part be due to underdistention. Correlate with urinalysis to exclude cystitis. 4. Cholelithiasis at evidence of acute cholecystitis. 5. Aortic Atherosclerosis (ICD10-I70.0). These results were called by telephone at the time of interpretation on 05/11/2019 at 5:50 am to Dr. Merrily Pew , who verbally acknowledged these results. Electronically Signed   By: Lovena Le M.D.   On: 05/11/2019 05:50   Ct Abdomen Pelvis W Contrast  Result Date: 05/11/2019 CLINICAL DATA:  Acute respiratory illness, productive cough wheezing and decreased fluid and fluid intake. History of melanoma, hypotension, asbestos exposure EXAM: CT CHEST, ABDOMEN, AND PELVIS WITH CONTRAST TECHNIQUE: Multidetector CT imaging of the chest, abdomen and pelvis was performed following the standard protocol during bolus administration of intravenous contrast. CONTRAST:  149m OMNIPAQUE IOHEXOL 300 MG/ML  SOLN  COMPARISON:  CT chest March 01, 2008 FINDINGS: CT CHEST FINDINGS Cardiovascular: Atherosclerotic plaque within the normal caliber aorta. Shared origin of the brachiocephalic and common carotid artery. Calcifications noted in the proximal great vessels. Pulmonary arteries are normal caliber. Normal heart size. No pericardial effusion. Atherosclerotic calcification of the coronary arteries. Aortic leaflet calcifications are present. Mediastinum/Nodes: There is diffuse low attenuation, circumferential thickening of the thoracic esophagus with a small sliding-type hiatal hernia. 11 mm paraesophageal lymph node is present near the diaphragmatic hiatus (3/45). Thyroid gland and thoracic inlet are unremarkable. Trachea is free of acute abnormality. No other suspicious mediastinal, hilar or axillary lymph nodes. Lungs/Pleura: There are extensive scattered calcified pleural plaques which are similar in extent to the study from 2009. No consolidation, features of edema, pneumothorax, or effusion. No suspicious pulmonary nodules or masses. Musculoskeletal: Exaggeration of the thoracic kyphosis with multilevel degenerative changes in the imaged portions of the spine. No suspicious osseous lesions are evident. CT ABDOMEN PELVIS FINDINGS Hepatobiliary: Diffuse hepatic hypoattenuation compatible with hepatic steatosis. No focal liver abnormality is seen. Calcified gallstones layering dependently within the gallbladder. No gallbladder wall thickening. No biliary ductal dilatation or calcified intraductal gallstones. Pancreas: Atrophic appearance of the pancreas. No ductal dilatation. Spleen: Normal in size without focal abnormality. Adrenals/Urinary Tract: Normal adrenal glands. Atrophic appearance of the kidneys with mild bilateral symmetric perinephric stranding, a nonspecific finding though may correlate with either age or decreased renal function. Few tiny subcentimeter hypoattenuating foci too small to fully characterize on CT  imaging but statistically likely benign. Kidneys are otherwise unremarkable, without renal calculi, suspicious lesion, or hydronephrosis. Circumferential bladder wall thickening though may in part be due to underdistention. Stomach/Bowel: Small sliding-type hiatal hernia, as above. Distal stomach and duodenum are unremarkable. Edematous mural thickening of the small bowel. Appendix is definitively identified. No colonic wall thickening or dilatation. Vascular/Lymphatic: Atherosclerotic plaque within the normal caliber aorta. Mild ostial narrowing of the renal arteries. No aneurysm or ectasia. No suspicious or enlarged lymph nodes in the included lymphatic chains. Reproductive: The prostate and seminal vesicles are unremarkable. Other: There is a moderate volume of low-attenuation (10 HU) intraperitoneal fluid with suspicious nodular, irregular peritoneal thickening most pronounced along the inferior and posterior extent of this collection. No free air. No bowel containing hernias. Musculoskeletal: Multilevel degenerative changes are present in the imaged portions of the spine. Straightening of the normal lumbar lordosis few large Schmorl's node formations are present most pronounced at L3. 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 No acute osseous abnormality or suspicious osseous lesion. Mild dextrocurvature of the spine. IMPRESSION: 1. New moderate volume of low-attenuation intraperitoneal fluid with suspicious nodular, irregular peritoneal thickening most pronounced along the inferior and  posterior extent of this collection, given the patient's history of asbestos related exposure in calcified pleural disease the overall appearance of this finding is suspicious for malignant peritoneal mesothelioma. Additional differential consideration includes peritoneal carcinomatosis with malignant ascites. 2. Diffuse low attenuation, circumferential thickening of the thoracic esophagus with small adjacent 11  mm paraesophageal lymph node near the diaphragmatic hiatus, findings could reflect an esophagitis though given above features malignancy should be excluded. Consider further evaluation with endoscopy. 3. Circumferential bladder wall thickening though may in part be due to underdistention. Correlate with urinalysis to exclude cystitis. 4. Cholelithiasis at evidence of acute cholecystitis. 5. Aortic Atherosclerosis (ICD10-I70.0). These results were called by telephone at the time of interpretation on 05/11/2019 at 5:50 am to Dr. Merrily Pew , who verbally acknowledged these results. Electronically Signed   By: Lovena Le M.D.   On: 05/11/2019 05:50   Nm Pulmonary Perf And Vent  Result Date: 05/16/2019 CLINICAL DATA:  Shortness of breath, worsening effort tolerance, dyspnea, question peritoneal carcinomatosis by CT, elevated D-dimer question pulmonary embolism EXAM: NUCLEAR MEDICINE VENTILATION - PERFUSION LUNG SCAN TECHNIQUE: Ventilation images were obtained in multiple projections using inhaled aerosol [id number]. Perfusion images were obtained in multiple projections after intravenous injection of Tc-974mAA. RADIOPHARMACEUTICALS:  30 mCi of Tc-9925mPA aerosol inhalation and 1.9 mCi Tc99m79m IV COMPARISON:  None Correlation: Chest radiograph 05/16/2019 FINDINGS: Ventilation: Diminished ventilation in RIGHT upper lobe. Central airway deposition of aerosol. No additional ventilation defects. Perfusion: Absent perfusion RIGHT upper lobe. Additional small subsegmental perfusion defect lingula which correlates to a calcified pleural plaque on CT. No additional perfusion defects. Chest radiograph: Calcified pleural plaque disease bilaterally. IMPRESSION: Small subsegmental perfusion defect in lingula corresponds to a calcified pleural plaque on CT. Absent perfusion RIGHT upper lobe with diminished though not absent ventilation in RIGHT upper lobe, ventilation better than perfusion. Solitary lobar mismatch  represents a low probability for pulmonary embolism by PIOPED II criteria. Overall findings represent a low probability for pulmonary embolism. Electronically Signed   By: MarkLavonia Dana.   On: 05/16/2019 11:44   Us PKoreaacentesis  Result Date: 05/16/2019 INDICATION: Ascites EXAM: ULTRASOUND GUIDED DIAGNOSTIC AND THERAPEUTIC PARACENTESIS MEDICATIONS: None. COMPLICATIONS: None immediate. PROCEDURE: Procedure, benefits, and risks of procedure were discussed with patient. Written informed consent for procedure was obtained. Time out protocol followed. Adequate collection of ascites localized by ultrasound in RIGHT lower quadrant. Skin prepped and draped in usual sterile fashion. Skin and soft tissues anesthetized with 10 mL of 1% lidocaine. 5 [place] catheter placed into peritoneal cavity. 4.2 L of serosanguineous ascitic fluid aspirated by vacuum bottle suction. Procedure tolerated well by patient without immediate complication. FINDINGS: A total of approximately 4.2 L of ascitic fluid was removed. Samples were sent to the laboratory as requested by the clinical team. IMPRESSION: Successful ultrasound-guided paracentesis yielding 4.2 liters of peritoneal fluid. Electronically Signed   By: MarkLavonia Dana.   On: 05/16/2019 12:01   Dg Abd Acute 2+v W 1v Chest  Result Date: 05/14/2019 CLINICAL DATA:  Vomiting. Constipation. EXAM: DG ABDOMEN ACUTE W/ 1V CHEST COMPARISON:  Body CT May 11, 2019 FINDINGS: Nonobstructive bowel gas pattern. Centralization of the bowels due to moderate volume ascites. Normal amount of formed stool throughout the colon. Normal cardiac silhouette. Partially calcified pleural plaques noted. Calcific atherosclerotic disease of the aorta. IMPRESSION: 1. Nonobstructive bowel gas pattern. 2. Moderate volume ascites. Electronically Signed   By: DobrFidela Salisbury.   On: 05/14/2019 19:31  Micro Results   Recent Results (from the past 240 hour(s))  Novel Coronavirus, NAA  ([facility])     Status: None   Collection Time: 05/09/19 10:14 AM   Specimen: Oropharyngeal(OP) collection in vial transport medium   OROPHARYNGEA  TESTING  Result Value Ref Range Status   SARS-CoV-2, NAA Not Detected Not Detected Final    Comment: This nucleic acid amplification test was developed and its perfomance characteristics determined by [employer]. Nucleic acid amplification tests include PCR and TMA. This test has not been FDA cleared or approved. This test has been authorized by FDA under an Emergency Use Authorization (EUA). This test is only authorized for the duration of time the declaration that circumstances exist justifying the authorization of the emergency use of in vitro diagnostic tests for detection of SARS-CoV-2 virus and/or diagnosis of COVID-19 infection under section 564(b)(1) of the Act, 21 U.S.C. 585IDP-8(E) (1), unless the authorization is terminated or revoked sooner. When diagnostic testing is negative, the possibility of a false negative result should be considered in the context of a patient's recent exposures and the presence of clinical signs and symptoms consistent with COVID-19. An individual without symptoms of COVID-19 and who is not shedding SARS-CoV-2 virus would  expect to have a negative (not detected) result in this assay.   SARS CORONAVIRUS 2 (TAT 6-24 HRS) Nasopharyngeal Nasopharyngeal Swab     Status: None   Collection Time: 05/14/19  8:52 PM   Specimen: Nasopharyngeal Swab  Result Value Ref Range Status   SARS Coronavirus 2 NEGATIVE NEGATIVE Final    Comment: (NOTE) SARS-CoV-2 target nucleic acids are NOT DETECTED. The SARS-CoV-2 RNA is generally detectable in upper and lower respiratory specimens during the acute phase of infection. Negative results do not preclude SARS-CoV-2 infection, do not rule out co-infections with other pathogens, and should not be used as the sole basis for treatment or other patient management  decisions. Negative results must be combined with clinical observations, patient history, and epidemiological information. The expected result is Negative. Fact Sheet for Patients: SugarRoll.be Fact Sheet for Healthcare Providers: https://www.woods-mathews.com/ This test is not yet approved or cleared by the [place] FDA and  has been authorized for detection and/or diagnosis of SARS-CoV-2 by FDA under an Emergency Use Authorization (EUA). This EUA will remain  in effect (meaning this test can be used) for the duration of the COVID-19 declaration under Section 56 4(b)(1) of the Act, 21 U.S.C. section 360bbb-3(b)(1), unless the authorization is terminated or revoked sooner. Performed at [facility], [place] [address] [place], [place] [address]   Culture, body fluid-bottle     Status: None (Preliminary result)   Collection Time: 05/16/19 11:25 AM   Specimen: Ascitic  Result Value Ref Range Status   Specimen Description ASCITIC  Final   Special Requests 10CC BOTTLES DRAWN AEROBIC AND ANAEROBIC  Final   Culture   Final    NO GROWTH 2 DAYS Performed at [facility], [address], [place], [place] [address]    Report Status PENDING  Incomplete  Gram stain     Status: None   Collection Time: 05/16/19 11:25 AM   Specimen: Ascitic  Result Value Ref Range Status   Specimen Description ASCITIC  Final   Special Requests NONE  Final   Gram Stain   Final    CYTOSPIN SMEAR ASCITIC FLUID WBC PRESENT, PREDOMINANTLY MONONUCLEAR NO ORGANISMS SEEN [facility] Performed at [facility], [address], [place], [place] [address]  Report Status 05/16/2019 FINAL  Final       Today   Subjective    Benjamyn Hestand today has no new complaints -Tolerating liquid diet well -Requesting discharge home -Daughter at bedside, questions answered          Patient has been seen and examined prior to discharge   Objective   Blood  pressure 98/60, pulse 88, temperature 98.2 F (36.8 C), temperature source Oral, resp. rate 17, height _0  (1.753 m), weight 77.9 kg, SpO2 95 %.   Intake/Output Summary (Last 24 hours) at 05/18/2019 1409 Last data filed at 05/18/2019 1300 Gross per 24 hour  Intake 3112.65 ml  Output 1500 ml  Net 1612.65 ml    Exam Gen:- Awake Alert,  In no apparent distress  HEENT:- [place].AT, No sclera icterus Ears- HOH Neck-Supple Neck,No JVD,.  Lungs-  CTAB , fair symmetrical air movement CV- S1, S2 normal, regular  Abd-  +ve B.Sounds, Abd Soft, much less distended after recent paracentesis, no significant tenderness Extremity/Skin:- No  edema, pedal pulses present  Psych-affect is appropriate, oriented x3 Neuro-generalized weakness, no new focal deficits, no tremors   Data Review   CBC w Diff:  Lab Results  Component Value Date   WBC 15.2 (H) 05/18/2019   HGB 9.9 (L) 05/18/2019   HCT 30.7 (L) 05/18/2019   PLT 290 05/18/2019   LYMPHOPCT 23 01/23/2016   MONOPCT 6.4 05/08/2019   EOSPCT 0.1 05/08/2019   BASOPCT 0.2 05/08/2019   CMP:  Lab Results  Component Value Date   NA 134 (L) 05/18/2019   K 4.0 05/18/2019   CL 104 05/18/2019   CO2 24 05/18/2019   BUN 22 05/18/2019   CREATININE 1.24 05/18/2019   CREATININE 1.27 (H) 05/08/2019   PROT 5.2 (L) 05/15/2019   ALBUMIN 1.9 (L) 05/15/2019   BILITOT 0.7 05/15/2019   ALKPHOS 69 05/15/2019   AST 24 05/15/2019   ALT 14 05/15/2019  . Total Discharge time is about 33 minutes  Roxan Hockey M.D on 05/18/2019 at 2:09 PM  Go to [email or web address] -  for contact info  [facility] - Office  [phone]

## 2019-05-18 NOTE — Progress Notes (Signed)
Decision made to cancel procedure today by anesthesia, patient and family, asking for information concerning benefit of treatment. Dr. Oneida Alar consulting oncology and health team.

## 2019-05-18 NOTE — Discharge Instructions (Signed)
1) we suspect that you most likely have cancer--esophageal (food pipe) cancer OR mesothelioma (cancer) in your belly--- you have decided for now not to have diagnostic biopsy to confirm this suspicion of cancer-- --if you change your mind and you would like to undergo endoscopy/EGD with biopsy---OR image guided biopsy of the possible cancer spots in your belly--- please let your oncologist Dr. Delton Coombes or your primary care physician know and I can arrange for diagnostic biopsy to be done 2) you may need paracentesis (removal of fluid from your belly) time to time if the fluid in your belly gets too much and you uncomfortable 3) nutritional supplements (ensure or boost) and multivitamin advised 4) please hold your blood pressure medications as her blood pressure has been running low

## 2019-05-18 NOTE — Telephone Encounter (Signed)
Returned patient's phone call regarding 09/11 appointment, per patient's relative request appointment needs to be cancelled. Patient will call back when ready to reschedule.

## 2019-05-18 NOTE — Consult Note (Signed)
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  EGD yesterday was canceled due to hypotension.  EGD this morning was also held due to increased risk of complications by anesthesia.  Patient was seen with his daughter at bedside.  Patient overall felt better after abdominal paracentesis.  He is able to drink clear liquids without any problems.  He has not had any solid foods to eat.  Objective: Vital signs in last 24 hours: Temp:  [97.9 F (36.6 C)-98.6 F (37 C)] 98.2 F (36.8 C) (09/10 0900) Pulse Rate:  [70-94] 88 (09/10 0900) Resp:  [16-18] 17 (09/10 0900) BP: (86-111)/(50-65) 98/60 (09/10 0900) SpO2:  [93 %-95 %] 95 % (09/10 0900)    Intake/Output from previous day: 09/09 0800 - 09/10 0759 In: 3122.7 [P.O.:360; I.V.:1512.7] Out: 1050 [Urine:1050]    Intake/Output this shift: Total I/O In: 240 [P.O.:240] Out: 800 [Urine:800]   PHYSICAL EXAM: BP 98/60 (BP Location: Right Arm)   Pulse 88   Temp 98.2 F (36.8 C) (Oral)   Resp 17   Ht 5\' 9"  (1.753 m)   Wt 171 lb 11.8 oz (77.9 kg)   SpO2 95%   BMI 25.36 kg/m  General appearance: alert, cooperative and appears stated age Lungs: clear to auscultation bilaterally Heart: regular rate and rhythm Abdomen: soft, non-tender; bowel sounds normal; no masses,  no organomegaly Extremities: extremities normal, atraumatic, no cyanosis or edema Skin: Skin color, texture, turgor normal. No rashes or lesions Lymph nodes: Cervical, supraclavicular, and axillary nodes normal. Neurologic: Grossly normal   Studies/Results: Results for orders placed or performed during the hospital encounter of 05/14/19 (from the past 48 hour(s))  Glucose, capillary     Status: None   Collection Time: 05/17/19  1:55 PM  Result Value  Ref Range   Glucose-Capillary 85 70 - 99 mg/dL   Comment 1 Notify RN    Comment 2 Document in Chart   CBC     Status: Abnormal   Collection Time: 05/18/19  6:05 AM  Result Value Ref Range   WBC 15.2 (H) 4.0 - 10.5 K/uL   RBC 3.37 (L) 4.22 - 5.81 MIL/uL   Hemoglobin 9.9 (L) 13.0 - 17.0 g/dL   HCT 30.7 (L) 39.0 - 52.0 %   MCV 91.1 80.0 - 100.0 fL   MCH 29.4 26.0 - 34.0 pg   MCHC 32.2 30.0 - 36.0 g/dL   RDW 14.1 11.5 - 15.5 %   Platelets 290 150 - 400 K/uL   nRBC 0.0 0.0 - 0.2 %    Comment: Performed at [facility], [address], [place], [place] [id number]  Basic metabolic panel     Status: Abnormal   Collection Time: 05/18/19  6:05 AM  Result Value Ref Range   Sodium 134 (L) 135 - 145 mmol/L   Potassium 4.0 3.5 - 5.1 mmol/L   Chloride 104 98 - 111 mmol/L   CO2 24 22 - 32 mmol/L   Glucose, Bld 101 (H) 70 - 99 mg/dL   BUN 22 8 - 23 mg/dL   Creatinine, Ser 1.24 0.61 - 1.24 mg/dL   Calcium 6.2 (LL) 8.9 - 10.3 mg/dL    Comment: CRITICAL RESULT CALLED TO, READ BACK BY AND VERIFIED WITH: EVAN,H AT 7:25AM ON 05/18/19 BY Derrill Memo  GFR calc non Af Amer 52 (L) >60 mL/min   GFR calc Af Amer >60 >60 mL/min   Anion gap 6 5 - 15    Comment: Performed at [facility], [address], [place], [place] [address]   No results found.    MEDICATIONS: I have reviewed the patient's current medications.     Assessment/Plan:  1.  Ascites: - Status post paracentesis on 05/16/2027 4.2 L of serosanguineous fluid removed. -Cytology was negative for malignancy. - CT abdomen and pelvis on 05/11/2019 showed irregular peritoneal thickening.  Need biopsy for diagnosis.  2.  Esophageal thickening: - CT scan on 05/11/2019 demonstrated thickening of the thoracic esophagus with small adjacent 11 mm paraesophageal lymph node near the diaphragmatic hiatus. - EGD today held by anesthesia as the patient was found to be high risk for the procedure. - I had a prolonged discussion with the patient and his  daughter.  We discussed all alternatives including biopsy versus best supportive care in the form of hospice.  Patient and his daughter were leaning towards biopsy but would like to discuss with his other daughters and let us know. - I have recommended EGD with possible biopsy and peritoneal biopsy for tissue diagnosis. -Once we have the biopsy results, then the patient and the family can decide if he wants to have any treatment done. -I have discussed this with Dr. Joesph Fillers.  3.  Normocytic anemia: -Hemoglobin today is 9.9.  Would recommend work-up in the form of ferritin, iron panel, [id number], folic acid, copper levels. - Anemia from chronic inflammation and CKD.  All questions were answered. The patient knows to call the clinic with any problems, questions or concerns. We can certainly see the patient much sooner if necessary.    Derek Jack

## 2019-05-18 NOTE — Progress Notes (Signed)
CRITICAL VALUE ALERT  Critical Value:  Ca 6.2  Date & Time Notied:  7:31 AM   Provider Notified Emokpae,C  Orders Received/Actions taken: paged

## 2019-05-18 NOTE — Progress Notes (Signed)
Nsg Discharge Note  Admit Date:  05/14/2019 Discharge date: 05/18/2019   Thomas [name] to be D/C'd Home per MD order.  AVS completed.  Copy for chart, and copy for patient signed, and dated. Patient/caregiver able to verbalize understanding.  Discharge Medication: Allergies as of 05/18/2019      Reactions   Penicillins Rash   REACTION: rash      Medication List    STOP taking these medications   amLODipine 5 MG tablet Commonly known as: NORVASC   atenolol-chlorthalidone 50-25 MG tablet Commonly known as: TENORETIC     TAKE these medications   acetaminophen 325 MG tablet Commonly known as: TYLENOL Take 2 tablets (650 mg total) by mouth every 6 (six) hours as needed for mild pain (or Fever >/= 101).   Eye Vitamins Caps Take 1 capsule by mouth daily.   ondansetron 4 MG disintegrating tablet Commonly known as: Zofran ODT Take 1 tablet (4 mg total) by mouth every 8 (eight) hours as needed for nausea or vomiting. 4mg  ODT q4 hours prn nausea/vomit What changed: reasons to take this   pantoprazole 40 MG tablet Commonly known as: PROTONIX Take 1 tablet (40 mg total) by mouth daily. What changed:   medication strength  how much to take   Xalatan 0.005 % ophthalmic solution Generic drug: latanoprost Place 1 drop into both eyes at bedtime.       Discharge Assessment: Vitals:   05/18/19 0708 05/18/19 0900  BP: (!) 111/56 98/60  Pulse: 94 88  Resp: 18 17  Temp: 98.6 F (37 C) 98.2 F (36.8 C)  SpO2: 94% 95%   Skin clean, dry and intact without evidence of skin break down, no evidence of skin tears noted. IV catheter discontinued intact. Site without signs and symptoms of complications - no redness or edema noted at insertion site, patient denies c/o pain - only slight tenderness at site.  Dressing with slight pressure applied.  D/c Instructions-Education: Discharge instructions given to patient/family with verbalized understanding. D/c education completed with  patient/family including follow up instructions, medication list, d/c activities limitations if indicated, with other d/c instructions as indicated by MD - patient able to verbalize understanding, all questions fully answered. Patient instructed to return to ED, call 911, or call MD for any changes in condition.  Patient escorted via [place], and D/C home via private auto.  Dorcas Mcmurray, LPN [phone] PM

## 2019-05-18 NOTE — Plan of Care (Signed)
AFTER DISCUSSION WITH ANESTHESIOLOGIST, PT DECLINES TO HAVE EGD TODAY. SPOKE WITH DR. KATRAGADDA. WILL SPEAK WITH PT. IF TISSUE DIAGNOSIS REQUIRED PT WILL NEED EGD  AND A PERCUTANEOUS BIOPSY OF PERITONEAL IMPLANT. IF NO TREATMENT DESIRED, NO PROCEDURES WILL BE NEEDED.  Will SIGN OFF. PLEASE CALL WITH QUESTIONS.

## 2019-05-19 ENCOUNTER — Inpatient Hospital Stay: Payer: Medicare Other | Admitting: Oncology

## 2019-05-19 ENCOUNTER — Other Ambulatory Visit: Payer: Self-pay

## 2019-05-19 ENCOUNTER — Ambulatory Visit (HOSPITAL_COMMUNITY)
Admission: RE | Admit: 2019-05-19 | Discharge: 2019-05-19 | Disposition: A | Discharge status: Home / Self Care | Payer: Medicare Other | Source: Ambulatory Visit | Attending: Family Medicine | Admitting: Family Medicine

## 2019-05-19 ENCOUNTER — Inpatient Hospital Stay: Payer: Medicare Other

## 2019-05-19 ENCOUNTER — Encounter (HOSPITAL_COMMUNITY): Payer: Self-pay

## 2019-05-19 ENCOUNTER — Telehealth: Payer: Self-pay

## 2019-05-19 ENCOUNTER — Telehealth: Payer: Self-pay | Admitting: Family Medicine

## 2019-05-19 DIAGNOSIS — R188 Other ascites: Secondary | ICD-10-CM

## 2019-05-19 LAB — CEA: CEA: 1.4 ng/mL (ref 0.0–4.7)

## 2019-05-19 NOTE — Telephone Encounter (Signed)
Order has been placed.

## 2019-05-19 NOTE — Progress Notes (Signed)
Paracentesis complete no signs of distress.  

## 2019-05-19 NOTE — Telephone Encounter (Signed)
I was able to get patient scheduled today for a STAT Ultrasound Paracentesis and they are heading over to have this done today at this time. Patient daughter asked if this is a standing order and I informed her that it was not. She states that she feels her dad is going to need this done every 3 days. I inquired about oncology and she states that they are unsure at this time if that is something that they want to do so they canceled oncology appointment at this time. She states that patient is unable to eat or drink anything without vomiting and his abdomen and feet are swollen. Please advise?

## 2019-05-19 NOTE — Telephone Encounter (Signed)
Please see results from Paracentesis performed today.

## 2019-05-19 NOTE — Procedures (Signed)
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

## 2019-05-19 NOTE — Telephone Encounter (Signed)
Pt daughter called to report that pt just got out of hosp with a Dx of cancer. Daughter states that pt's stomach is swelling and they need an order to drain his abd per hosp. Please advise.

## 2019-05-19 NOTE — Telephone Encounter (Signed)
We must see how often it accumulates before we can make it a standing order.

## 2019-05-21 LAB — CULTURE, BODY FLUID W GRAM STAIN -BOTTLE: Culture: NO GROWTH

## 2019-05-22 ENCOUNTER — Telehealth: Payer: Self-pay | Admitting: Family Medicine

## 2019-05-22 NOTE — Telephone Encounter (Signed)
Patient's daughter Anderson Malta called in stating that they are having a hard time understanding patient's diagnoses. States that they were told that he has a life expectancy of 3-6 months however they do not understand where the cancer is. She also states that her dad is extremely weak and still unable to eat or drink hardly anything because he is vomiting, he has not been sleeping due to worrying about his condition. She states that she knows he has an appointment on Thursday but they do not want to wait that long to speak with you. Also they would like a refill on his nausea medication. Please advise?

## 2019-05-23 ENCOUNTER — Ambulatory Visit (INDEPENDENT_AMBULATORY_CARE_PROVIDER_SITE_OTHER): Payer: Medicare Other | Admitting: Family Medicine

## 2019-05-23 ENCOUNTER — Other Ambulatory Visit: Payer: Self-pay | Admitting: Family Medicine

## 2019-05-23 ENCOUNTER — Other Ambulatory Visit: Payer: Self-pay

## 2019-05-23 ENCOUNTER — Telehealth: Payer: Self-pay | Admitting: Oncology

## 2019-05-23 DIAGNOSIS — R180 Malignant ascites: Secondary | ICD-10-CM

## 2019-05-23 DIAGNOSIS — C451 Mesothelioma of peritoneum: Secondary | ICD-10-CM | POA: Diagnosis not present

## 2019-05-23 NOTE — Progress Notes (Signed)
Subjective:    Patient ID: Thomas [name], male    DOB: 1929/10/04, 85 y.o.   MRN: [id number]  HPI Unfortunately, the patient was recently admitted with intractable nausea and vomiting.  I have copied relevant portions of his discharge summary and included them below for reference: Discharge Date:  05/18/2019   Primary MD  Susy Frizzle, MD  Recommendations for primary care physician for things to follow:   1) we suspect that you most likely have cancer--esophageal (food pipe) cancer OR mesothelioma (cancer) in your belly--- you have decided for now not to have diagnostic biopsy to confirm this suspicion of cancer-- --if you change your mind and you would like to undergo endoscopy/EGD with biopsy---OR image guided biopsy of the possible cancer spots in your belly--- please let your oncologist Dr. Delton Coombes or your primary care physician know and I can arrange for diagnostic biopsy to be done 2) you may need paracentesis (removal of fluid from your belly) time to time if the fluid in your belly gets too much and you uncomfortable 3) nutritional supplements (ensure or boost) and multivitamin advised 4) please hold your blood pressure medications as her blood pressure has been running low  Admission Diagnosis  Dehydration [E86.0] Intractable vomiting with nausea, unspecified vomiting type [R11.2]   Discharge Diagnosis  Dehydration [E86.0] Intractable vomiting with nausea, unspecified vomiting type [R11.2]   Principal Problem:   ARF (acute renal failure) ([place]) Active Problems:   Essential hypertension   Hyponatremia   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 ANEMIA 02/08/2008  . ARTHRITIS 02/08/2008  . Cataract    left eye  . 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 ELEVATED  PROSTATE SPECIFIC ANTIGEN 02/08/2008  . GLAUCOMA, LEFT EYE 02/07/2009   left eye  . HISTORY OF ASBESTOS EXPOSURE 02/08/2008  . HYPERLIPIDEMIA 02/08/2008  . Hypertension   . HYPOTENSION 02/18/2010  . HYPOTHYROIDISM 02/08/2008  . Melanoma ([place])    left shoulder  . 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 VITAMIN D DEFICIENCY 02/07/2009         Past Surgical History:  Procedure Laterality Date  . dupyreenes contractures-bilat    . SKIN CANCER EXCISION         HPI  from the history and physical done on the day of admission:     -FrankWattersonis a89 y.o.male,w hypertension, hyperlipidemia, anemia, vitamin D deficiency, elevated Psa (could not confirm , labs in Epic normal), h/o melanoma, recent new diagnosis of mesothelioma w ascites , apparently presents with c/o nausea , emesis and weakness over the past 3-4weeks.Pt denies fever, chills, cough, cp, palp, sob, abd pain, diarrhea, brbpr, dysuria, hematuria.  In ED,  T 98 P 102, R 18, Bp 99/56 Pox 97% on RA Wt 77.6 kg  Xray Abdomen, IMPRESSION: 1. Nonobstructive bowel gas pattern. 2. Moderate volume ascites.  Wbc 16.0, Hgb 10.8, Plt 391 Na 131, K 4.2, Bun 41, Creatinine 1.76 Alb 2.1 Ast 28, Alt 17, Alk phos 79, T. Bili 0.3  Urinalysis pending.  EKG nsr at 100, nl axis,st depression in v4-6 Trop 38  Pt will be admitted for n/v, and dehydration and acute renal failure.    Hospital Course:   Brief Summary 85 y.o.male,w hypertension, hyperlipidemia, anemia, vitamin  D deficiency, elevated Psa (could not confirm , labs in Epic normal), h/o melanoma, recent new diagnosis of possible esophageal carcinoma versus mesothelioma w ascites, emesis and abdominal discomfort  A/p 1)Recurrent emesis--- abdominal imaging without obstructive findings, -EGD postponed until 05/18/2019 due to hypotension - -patient and daughter declined EGD on 19 2020 after discussion of risk versus benefit with  anesthesiologist -  2) presumed malignant ascites---status postdiagnostic paracentesis- gram stain and cultureNGTD -Paracentesis on 05/16/2019 with 4.2 L of serosanguineous fluid removed, -Cytology iswith atypical cells/inconclusive - CEA level pending. - Patient and daughter declined EGD and possible diagnostic biopsy of suspicious lesion at the esophageal thickening. -Oncologist recommends biopsy of the peritoneal nodules if the cytology is inconclusive--patient and her daughter declines further diagnostic biopsy at this time -Peritoneal fluid cell count is > 2100.Marland Kitchen Suspect inflammatory (/reactive) rather than Ibn SBP -Peritoneal fluid LDH is 1633  3)Elevated d-dimer--may be related to underlying malignancy/mesothelioma--- unable to do CTA chest for PE rule out due to elevated creatinine,  --VQ scan low probability for PE  4)HTN-discontinue BP meds as BP has been soft  5)AKI----acute kidney injury on CKD stage - III creatinine on admission= 1.76 , baseline creatinine = 1.2 to 1.3 , creatinine is now= 1.2, renally adjust medications, avoid nephrotoxic agents/dehydration/hypotension  6)FEN/dysphagia/--difficulty with oral intake, calcium is 6.2, corrected for low albumin of 1.9 calcium is actually 7.8.  -Liquid/soft diet advised -patient and daughter declined EGD on 19 2020 after discussion of risk versus benefit with anesthesiologist  7)Social/Ethics--patient and daughter declines  diagnostic biopsy at this time, they declined palliative care and hospice involvement at this time--- they want to have time to talk things over as a family -Patient requesting discharge home today -He will talk with his family and make decisions regarding pursuing further diagnostic and treatment options Or going with palliative care and hospice-- --when he decides on whether he wants to go with palliative / hospice service or pursue further aggressive work-up and treatment he will  notify his PCP and Dr. Delton Coombes  Code Status:Full   Patient called back last Friday and was again demonstrating abdominal distention.  Fortunately we were able to work the patient in interventional radiology for a therapeutic paracentesis.  Patient had more than 5 L of fluid removed.  He continues to report declining health.  He has intractable nausea.  He reports very little appetite.  He reports that he is losing strength.  He has a difficult time even walking into the clinic for his exam today.  He is here today with his family for discussion of plan of care.  The patient seems quite confused as to what the diagnosis is.  He has not been told that he has cancer.  His son appears to be aware of the diagnosis however I believe the patient does not have a clear picture of what he is truly facing.  He is accompanied today by his son and his daughter.  I spent more than 30 minutes today with the patient in discussion.  No exam was performed today.  Our entire office visit today was in discussion regarding his situation Past Medical History:  Diagnosis Date  . ANEMIA 02/08/2008  . ARTHRITIS 02/08/2008  . Cataract    left eye  . 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 ELEVATED PROSTATE SPECIFIC ANTIGEN 02/08/2008  . GLAUCOMA, LEFT EYE 02/07/2009   left eye  . HISTORY OF ASBESTOS EXPOSURE 02/08/2008  . HYPERLIPIDEMIA 02/08/2008  . Hypertension   . HYPOTENSION 02/18/2010  .  HYPOTHYROIDISM 02/08/2008  . Melanoma ([place])    left shoulder  . 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 VITAMIN D DEFICIENCY 02/07/2009   Past Surgical History:  Procedure Laterality Date  . dupyreenes contractures-bilat    . SKIN CANCER EXCISION     Current Outpatient Medications on File Prior to Visit  Medication Sig Dispense Refill  . acetaminophen (TYLENOL) 325 MG tablet Take 2 tablets (650 mg total) by mouth every 6 (six) hours as needed for mild pain (or Fever >/= 101). 12 tablet 0  . latanoprost (XALATAN) 0.005 % ophthalmic  solution Place 1 drop into both eyes at bedtime.     . Multiple Vitamins-Minerals (EYE VITAMINS) CAPS Take 1 capsule by mouth daily.     . ondansetron (ZOFRAN ODT) 4 MG disintegrating tablet Take 1 tablet (4 mg total) by mouth every 8 (eight) hours as needed for nausea or vomiting. 56m ODT q4 hours prn nausea/vomit 20 tablet 3  . pantoprazole (PROTONIX) 40 MG tablet Take 1 tablet (40 mg total) by mouth daily. 30 tablet 2  . [DISCONTINUED] fluticasone (FLONASE) 50 MCG/ACT nasal spray Place 2 sprays into both nostrils daily. 16 g 6  . [DISCONTINUED] levocetirizine (XYZAL) 5 MG tablet Take 1 tablet (5 mg total) by mouth every evening. 30 tablet 0   No current facility-administered medications on file prior to visit.    Allergies  Allergen Reactions  . Penicillins Rash    REACTION: rash   Social History   Socioeconomic History  . Marital status: Married    Spouse name: Not on file  . Number of children: Not on file  . Years of education: Not on file  . Highest education level: Not on file  Occupational History  . Not on file  Social Needs  . Financial resource strain: Not on file  . Food insecurity    Worry: Not on file    Inability: Not on file  . Transportation needs    Medical: Not on file    Non-medical: Not on file  Tobacco Use  . Smoking status: Former Smoker    Quit date: 10/08/1971    Years since quitting: 47.6  . Smokeless tobacco: Never Used  Substance and Sexual Activity  . Alcohol use: No    Alcohol/week: 0.0 standard drinks  . Drug use: No  . Sexual activity: Not Currently  Lifestyle  . Physical activity    Days per week: Not on file    Minutes per session: Not on file  . Stress: Not on file  Relationships  . Social [place] phone: Not on file    Gets together: Not on file    Attends religious service: Not on file    Active member of club or organization: Not on file    Attends meetings of clubs or organizations: Not on file    Relationship  status: Not on file  . Intimate partner violence    Fear of current or ex partner: Not on file    Emotionally abused: Not on file    Physically abused: Not on file    Forced sexual activity: Not on file  Other Topics Concern  . Not on file  Social History Narrative  . Not on file      Review of Systems  All other systems reviewed and are negative.      Objective:   Physical Exam Constitutional:      Appearance: He is underweight. He  is ill-appearing.   No exam was performed today.  Our entire visit today, more than 30 minutes, was in discussion        Assessment & Plan:  Malignant ascites  Peritoneal mesothelioma ([place])  I spent 30 minutes today with the patient and his daughter and his son discussing his diagnosis.  I explained that the paracentesis was not curative.  I explained that this is only a temporizing measure.  The fluid will undoubtedly recur.  I explained that the fluid was due to the metastatic lesions on the peritoneum.  I explained although we cannot be certain, this is most likely cancer.  I explained that this is most likely due to mesothelioma.  However I was again clear that we cannot be certain without a biopsy.  I outlined 2 treatment courses for the patient.  Option 1, we try to biopsy 1 of the peritoneal lesions as recommended by oncology in the hospital.  I will try to arrange this with interventional radiology if possible.  Once we have pathologic diagnosis, oncology could then discuss most likely chemotherapy in an effort to try to prolong life.  I explained that chemotherapy would most likely not be curative.  The other option would be not to pursue a diagnostic biopsy.  Instead treat the patient symptomatically and focus on quality, not quantity of life.  If we pursue this path, I would recommend a hospice consultation.  Patient is uncertain which path he prefers.  He has an appointment to meet with oncology in less than a week.  I have strongly  encouraged him to discuss his situation with oncology.  I believe his performance status is poor.  I do not believe that he would tolerate surgery or chemotherapy well.  I will discuss his case with interventional radiology to see if they feel that if they can perform a guided needle biopsy of 1 of these lesions to avoid surgical biopsy.

## 2019-05-23 NOTE — Telephone Encounter (Signed)
Mr. Abajian daughter, Pennie Rushing, to r/s his appt w/Dr. Jana Hakim to 9/22 at 430pm w/labs at 4pm. She states that her father may or may not keep the appt. If not she will cb to cancel the appt.

## 2019-05-23 NOTE — Telephone Encounter (Signed)
error 

## 2019-05-25 ENCOUNTER — Other Ambulatory Visit: Payer: Self-pay | Admitting: Family Medicine

## 2019-05-25 ENCOUNTER — Telehealth: Payer: Self-pay | Admitting: Family Medicine

## 2019-05-25 ENCOUNTER — Inpatient Hospital Stay: Payer: Medicare Other | Admitting: Family Medicine

## 2019-05-25 DIAGNOSIS — R180 Malignant ascites: Secondary | ICD-10-CM

## 2019-05-25 DIAGNOSIS — R188 Other ascites: Secondary | ICD-10-CM

## 2019-05-25 NOTE — Telephone Encounter (Signed)
Order entered

## 2019-05-25 NOTE — Telephone Encounter (Signed)
Patients daughter called and lm saying that mr Wallis needed parasyndesis tonight and needs order for this  asap

## 2019-05-25 NOTE — Progress Notes (Signed)
US ordered

## 2019-05-26 ENCOUNTER — Ambulatory Visit (HOSPITAL_COMMUNITY)
Admission: RE | Admit: 2019-05-26 | Discharge: 2019-05-26 | Disposition: A | Discharge status: Home / Self Care | Payer: Medicare Other | Source: Ambulatory Visit | Attending: Family Medicine | Admitting: Family Medicine

## 2019-05-26 ENCOUNTER — Encounter (HOSPITAL_COMMUNITY): Payer: Self-pay

## 2019-05-26 ENCOUNTER — Other Ambulatory Visit: Payer: Self-pay

## 2019-05-26 DIAGNOSIS — R188 Other ascites: Secondary | ICD-10-CM | POA: Diagnosis not present

## 2019-05-26 NOTE — Progress Notes (Signed)
Paracentesis complete no signs of distress.  

## 2019-05-29 ENCOUNTER — Other Ambulatory Visit: Payer: Self-pay

## 2019-05-29 ENCOUNTER — Telehealth: Payer: Self-pay | Admitting: *Deleted

## 2019-05-29 DIAGNOSIS — D649 Anemia, unspecified: Secondary | ICD-10-CM

## 2019-05-29 NOTE — Telephone Encounter (Signed)
This RN returned VM left requesting a return call regarding pt's appointment tomorrow.  Per call this RN was informed that the patient's wife passed today.   " how many people can come with him tomorrow ?"   This RN discussed visitation policy - as well possible need to reschedule due to the circumstances.   Anderson Malta states the family is very concern about their father's health and recent decline that they overall feel better keeping the appointment.  Anderson Malta stated as well " we haven't talked about the appointment tomorrow with dad and we may need to reschedule but was trying to work out a plan " She states her dad is forgetful " and febile " and with today's event the family feels he needs someone with him.  Plan at present is for family member to accompany patient to visit. If family needs to reschedule- they will call.  Nurse covering desk tomorrow made aware of above. Message put on appointment for family member to accompany to visit. This RN left message on nurse director's VM per above and need for family member.

## 2019-05-29 NOTE — Progress Notes (Signed)
[facility]  [phone] [phone]    ID: Thomas [name] DOB: [birth year]  MR#: [id number]  [id number]  Patient Care Team: Susy Frizzle, MD as PCP - General (Family Medicine) OTHER MD:   CHIEF COMPLAINT: Abdominal carcinomatosis  CURRENT TREATMENT: Work-up in progress   HISTORY OF CURRENT ILLNESS: Thomas [name] followed up with his PCP, Dr. Jenna Luo, on 05/09/2019 following some abnormal labs, including: WBC at 16,000, RBC at 3.93, HGB at 11.6, HCT at 34.0, Platelets at 528,000, and Sed Rate at 50. He was placed on Levaquin 500 mg PO daily for 7 days. During this visit he also had a cough without production and abdominal pain; he was instructed to go to the ER if his symptoms worsened.   He underwent a chest xray on 05/10/2019 showing: Cardiomediastinal silhouette is normal. Mediastinal contours appear intact. There is no evidence of focal airspace consolidation, pleural effusion or pneumothorax. Bilateral calcified pleural plaques are noted. Osseous structures are without acute abnormality. Soft tissues are grossly normal.  He then underwent a chest, abdomen, and pelvis CT on 05/11/2019 showing: New moderate volume of low-attenuation intraperitoneal fluid with suspicious nodular, irregular peritoneal thickening most pronounced along the inferior and posterior extent of this collection, given the patient's history of asbestos related exposure in calcified pleural disease the overall appearance of this finding is suspicious for malignant peritoneal mesothelioma. Additional differential consideration includes peritoneal carcinomatosis with malignant ascites. Diffuse low attenuation, circumferential thickening of the thoracic esophagus with small adjacent 11 mm paraesophageal lymph node near the diaphragmatic hiatus, findings could reflect an esophagitis though given above features malignancy should be excluded. Consider further evaluation  with endoscopy. Circumferential bladder wall thickening though may in part be due to underdistention. Correlate with urinalysis to exclude cystitis. Cholelithiasis at evidence of acute cholecystitis. Aortic Atherosclerosis (ICD10-I70.0).  He underwent an abdomen and chest xray on 05/14/2019 showing: Nonobstructive bowel gas pattern. Centralization of the bowels due to moderate volume ascites. Normal amount of formed stool throughout the colon. Normal cardiac silhouette. Partially calcified pleural plaques noted. Calcific atherosclerotic disease of the aorta.   He underwent a lung scan on 05/16/2019 showing: Small subsegmental perfusion defect in lingula corresponds to a calcified pleural plaque on CT. Absent perfusion RIGHT upper lobe with diminished though not absent ventilation in RIGHT upper lobe, ventilation better than perfusion. Solitary lobar mismatch represents a low probability for pulmonary embolism by PIOPED II criteria. Overall findings represent a low probability for pulmonary embolism.  The then underwent paracentesis yielding 4.2 L of peritoneal fluid on 05/16/2019, 5.1 L on 05/19/2019, and 2 L on 05/26/2019.   Cytology was performed on the 05/16/2019 peritoneal fluid. The pathology from this procedure showed [id number]): Peritoneal/ascitic fluid(specimen 1 of 1 collected 05/16/19): Atypical cells present.   See comment: There is likely insufficient material for additional studies.   The patient's subsequent history is as detailed below.   INTERVAL HISTORY: Thomas [name] was evaluated in the hematology clinic on 05/30/2019 accompanied by his daughter Thomas [name].  The visit was taped so that the other children could later participate  I have to add that the patient's wife Thomas [name] died yesterday.  She had a rapid decline in the last 3 weeks according to the patient.  She had significant dementia.  He was her primary caregiver  REVIEW OF SYSTEMS: Thomas [name] tells me that if he can get up from a chair he  can walk 5 or 10 steps.  He understands he is at high risk  of falling.  He has not been eating or drinking much partly because it is difficult for him and partly because of the events surrounding his wife's death.  He did have a bowel movement this morning which he describes as normal.  He is having no pain.  He has no cough or pleurisy but is making some mucus he says.  There is been no hemoptysis or hematemesis.  Apparently he is able to get liquids down moderately well, although sometimes they come back up.  There have been no unusual headaches, or visual changes.  He felt significant relief after he underwent paracentesis but thinks is abdominal fluid is building up again.  There has been no fever.  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  His daughter Thomas [name] and her husband Thomas [name] live right next door.  The patient had a total of 8 children.  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  Thomas [name] is a retired  Clinical biochemist living in [place].  Thomas [name] living in [place] is disabled.  Thomas [name] lives in [place].  She is a Agricultural engineer.  Her husband is an Clinical biochemist.  Thomas [name] lives in [place] and is a homemaker.  Daughter Thomas [name] died 3 years ago from a bleeding problem which is not clear to the family.  Son Thomas [name] lives in and [place].  The patient has 17 grandchildren and 8-10 great-grandchildren.  He is not a church attender   ADVANCED DIRECTIVES:     HEALTH MAINTENANCE: Social History   Tobacco Use   Smoking status: Former Smoker    Quit date: 10/08/1971    Years since quitting: 47.6   Smokeless tobacco: Never Used  Substance Use Topics   Alcohol use: No    Alcohol/week: 0.0 standard drinks   Drug use: No    Colonoscopy: Never  PSA not elevated 2017    Allergies  Allergen Reactions   Penicillins Rash    REACTION: rash    Current Outpatient Medications  Medication Sig Dispense Refill   acetaminophen (TYLENOL) 325 MG tablet Take 2 tablets (650 mg total) by mouth every 6 (six) hours as needed for mild pain (or Fever >/= 101). 12 tablet 0   latanoprost (XALATAN) 0.005 % ophthalmic solution Place 1 drop into both eyes at bedtime.      Multiple Vitamins-Minerals (EYE VITAMINS) CAPS Take 1 capsule by mouth daily.  ondansetron (ZOFRAN ODT) 4 MG disintegrating tablet Take 1 tablet (4 mg total) by mouth every 8 (eight) hours as needed for nausea or vomiting. 4mg  ODT q4 hours prn nausea/vomit 20 tablet 3   pantoprazole (PROTONIX) 40 MG tablet Take 1 tablet (40 mg total) by mouth daily. 30 tablet 2   No current facility-administered medications for this visit.      OBJECTIVE: Older white man examined in a wheelchair  Vitals:   05/30/19 1626  BP: (!) 87/54  Pulse: 83  Resp: 18  Temp: 98.3 F (36.8 C)  SpO2: 92%   Wt Readings from Last 3 Encounters:  05/17/19 171 lb 11.8 oz (77.9 kg)  05/11/19 176 lb (79.8 kg)  05/09/19 176 lb (79.8 kg)   Body mass index is 25.36 kg/m.      ECOG FS:3 - Symptomatic, >50% confined to bed  Ocular: Sclerae unicteric, pupils round and equal Ear-nose-throat: Wearing a mask Lymphatic: No cervical or supraclavicular adenopathy Lungs no rales or rhonchi Heart regular rate and rhythm Abd soft, nontender, slightly distended, positive bowel sounds Neuro: non-focal, well-oriented, appropriate affect  LAB RESULTS:  CMP     Component Value Date/Time   NA 131 (L) 05/30/2019 1612   K 5.8 (H) 05/30/2019 1612   CL 96 (L) 05/30/2019 1612   CO2 23 05/30/2019 1612   GLUCOSE 106 (H) 05/30/2019 1612   BUN 46 (H) 05/30/2019 1612   CREATININE 1.83 (H) 05/30/2019 1612   CREATININE 1.27 (H) 05/08/2019 1132   CALCIUM 7.8 (L) 05/30/2019 1612   PROT 5.2 (L) 05/30/2019 1612   ALBUMIN 1.6 (L) 05/30/2019 1612   AST 35 05/30/2019 1612   ALT 19 05/30/2019 1612   ALKPHOS 143 (H) 05/30/2019 1612   BILITOT 0.7 05/30/2019 1612   GFRNONAA 32 (L) 05/30/2019 1612   GFRNONAA 50 (L) 05/08/2019 1132   GFRAA 38 (L) 05/30/2019 1612   GFRAA 58 (L) 05/08/2019 1132    No results found for: TOTALPROTELP, ALBUMINELP, A1GS, A2GS, BETS, BETA2SER, GAMS, MSPIKE, SPEI  No results found for: KPAFRELGTCHN, LAMBDASER, KAPLAMBRATIO  Lab Results  Component Value Date   WBC 23.6 (H) 05/30/2019   NEUTROABS 22.2 (H) 05/30/2019   HGB 12.7 (L) 05/30/2019   HCT 37.9 (L) 05/30/2019   MCV 85.6 05/30/2019   PLT 308 05/30/2019    No results found for: LABCA2  No components found for: [id number]  No results for input(s): INR in the last 168 hours.  No results found for: LABCA2  No results found for: CAN199  No results found for: [id number]  No results found for: [id number]  No results found for: CA2729  No components found for: HGQUANT  Lab Results  Component Value Date   CEA1 1.4 05/18/2019   /  CEA  Date Value Ref Range Status  05/18/2019 1.4 0.0 - 4.7 ng/mL Final    Comment:    (NOTE)                             Nonsmokers          <3.9                              Smokers             <5.6 Roche [employer] Electrochemiluminescence Immunoassay (ECLIA) Values obtained with different assay methods or kits cannot be used interchangeably.  Results cannot be interpreted  as absolute evidence of the presence or absence of malignant disease. Performed At: [facility] [place], [place] [id number] Rush Farmer MD [id number]      No results found for: AFPTUMOR  No results found for: [place]  No results found for: PSA1  Appointment on 05/30/2019  Component Date Value Ref Range Status   Sodium 05/30/2019 131* 135 - 145 mmol/L Final   Potassium 05/30/2019 5.8* 3.5 - 5.1 mmol/L Final   Chloride 05/30/2019 96* 98 - 111 mmol/L Final   CO2 05/30/2019 23  22 - 32 mmol/L Final   Glucose, Bld 05/30/2019 106* 70 - 99 mg/dL Final   BUN 05/30/2019 46* 8 - 23 mg/dL Final   Creatinine, Ser 05/30/2019 1.83* 0.61 - 1.24 mg/dL Final   Calcium 05/30/2019 7.8* 8.9 - 10.3 mg/dL Final   Total Protein 05/30/2019 5.2* 6.5 - 8.1 g/dL Final   Albumin 05/30/2019 1.6* 3.5 - 5.0 g/dL Final   AST 05/30/2019 35  15 - 41 U/L Final   ALT 05/30/2019 19  0 - 44 U/L Final   Alkaline Phosphatase 05/30/2019 143* 38 - 126 U/L Final   Total Bilirubin 05/30/2019 0.7  0.3 - 1.2 mg/dL Final   GFR calc non Af Amer 05/30/2019 32* >60 mL/min Final   GFR calc Af Amer 05/30/2019 38* >60 mL/min Final   Anion gap 05/30/2019 12  5 - 15 Final   Performed at [facility], [place] [address] [place], [place] [address]   WBC 05/30/2019 23.6* 4.0 - 10.5 K/uL Final   RBC 05/30/2019 4.43  4.22 - 5.81 MIL/uL Final   Hemoglobin 05/30/2019 12.7* 13.0 - 17.0 g/dL Final   HCT 05/30/2019 37.9* 39.0 - 52.0 % Final   MCV 05/30/2019 85.6  80.0 - 100.0 fL Final   MCH 05/30/2019 28.7  26.0 - 34.0 pg Final   MCHC 05/30/2019 33.5  30.0 - 36.0 g/dL Final   RDW 05/30/2019 14.9  11.5 - 15.5 % Final   Platelets 05/30/2019  308  150 - 400 K/uL Final   nRBC 05/30/2019 0.0  0.0 - 0.2 % Final   Neutrophils Relative % 05/30/2019 93  % Final   Neutro Abs 05/30/2019 22.2* 1.7 - 7.7 K/uL Final   Lymphocytes Relative 05/30/2019 3  % Final   Lymphs Abs 05/30/2019 0.6* 0.7 - 4.0 K/uL Final   Monocytes Relative 05/30/2019 3  % Final   Monocytes Absolute 05/30/2019 0.6  0.1 - 1.0 K/uL Final   Eosinophils Relative 05/30/2019 0  % Final   Eosinophils Absolute 05/30/2019 0.0  0.0 - 0.5 K/uL Final   Basophils Relative 05/30/2019 0  % Final   Basophils Absolute 05/30/2019 0.0  0.0 - 0.1 K/uL Final   Immature Granulocytes 05/30/2019 1  % Final   Abs Immature Granulocytes 05/30/2019 0.20* 0.00 - 0.07 K/uL Final   Performed at [facility], [place] [address] [place], [place] [address]   Retic Ct Pct 05/30/2019 1.6  0.4 - 3.1 % Final   RBC. 05/30/2019 4.40  4.22 - 5.81 MIL/uL Final   Retic Count, Absolute 05/30/2019 70.8  19.0 - 186.0 K/uL Final   Immature Retic Fract 05/30/2019 17.5* 2.3 - 15.9 % Final   Performed at [facility], [place] [address] [place], [place] [address]   Smear Review 05/30/2019 SMEAR STAINED AND AVAILABLE FOR REVIEW   Final   Performed at [facility], [place] [address] [place],  [place] [address]   Folate 05/30/2019 6.9  >5.9 ng/mL Final   Performed at [place] [address], [place], [place] [address]   Vitamin B-12 05/30/2019 889  180 - 914 pg/mL Final   Comment: (NOTE) This assay is not validated for testing neonatal or myeloproliferative syndrome specimens for Vitamin B12 levels. Performed at [facility], [place] [place], [place] [address]     (this displays the last labs from the last 3 days)  No results found for: TOTALPROTELP, ALBUMINELP, A1GS, A2GS, BETS, BETA2SER, GAMS, MSPIKE, SPEI (this displays SPEP labs)  No results found for: KPAFRELGTCHN,  LAMBDASER, KAPLAMBRATIO (kappa/lambda light chains)  No results found for: HGBA, HGBA2QUANT, HGBFQUANT, HGBSQUAN (Hemoglobinopathy evaluation)   No results found for: LDH  No results found for: IRON, TIBC, IRONPCTSAT (Iron and TIBC)  No results found for: FERRITIN  Urinalysis    Component Value Date/Time   COLORURINE YELLOW 05/14/2019 1500   APPEARANCEUR HAZY (A) 05/14/2019 1500   LABSPEC 1.023 05/14/2019 1500   PHURINE 5.0 05/14/2019 1500   GLUCOSEU NEGATIVE 05/14/2019 1500   HGBUR NEGATIVE 05/14/2019 1500   HGBUR trace-lysed 03/19/2010 0818   BILIRUBINUR NEGATIVE 05/14/2019 1500   BILIRUBINUR n 01/09/2014 1027   KETONESUR NEGATIVE 05/14/2019 1500   PROTEINUR NEGATIVE 05/14/2019 1500   UROBILINOGEN 0.2 01/09/2014 1027   UROBILINOGEN 0.2 03/19/2010 0818   NITRITE NEGATIVE 05/14/2019 1500   LEUKOCYTESUR NEGATIVE 05/14/2019 1500     STUDIES:  Dg Chest 2 View  Result Date: 05/16/2019 CLINICAL DATA:  85 year old male with shortness of breath. EXAM: CHEST - 2 VIEW COMPARISON:  CT Chest, Abdomen, and Pelvis 05/11/2019, and earlier. FINDINGS: Calcified pleural plaques in both lungs. Mediastinal contours remain normal. Visualized tracheal air column is within normal limits. No pneumothorax, pulmonary edema, pleural effusion or acute pulmonary opacity. Calcified aortic atherosclerosis. No acute osseous abnormality identified. Paucity of bowel gas in the upper abdomen. IMPRESSION: Stable calcified pleural plaques. No acute cardiopulmonary abnormality. Electronically Signed   By: Genevie Ann M.D.   On: 05/16/2019 10:11   Dg Chest 2 View  Result Date: 05/10/2019 CLINICAL DATA:  Cough. EXAM: CHEST - 2 VIEW COMPARISON:  Jan 09, 2014 FINDINGS: Cardiomediastinal silhouette is normal. Mediastinal contours appear intact. There is no evidence of focal airspace consolidation, pleural effusion or pneumothorax. Bilateral calcified pleural plaques are noted. Osseous structures are without acute  abnormality. Soft tissues are grossly normal. IMPRESSION: 1. No active cardiopulmonary disease. 2. Bilateral calcified pleural plaques. Electronically Signed   By: Fidela Salisbury M.D.   On: 05/10/2019 20:13   Ct Chest W Contrast  Result Date: 05/11/2019 CLINICAL DATA:  Acute respiratory illness, productive cough wheezing and decreased fluid and fluid intake. History of melanoma, hypotension, asbestos exposure EXAM: CT CHEST, ABDOMEN, AND PELVIS WITH CONTRAST TECHNIQUE: Multidetector CT imaging of the chest, abdomen and pelvis was performed following the standard protocol during bolus administration of intravenous contrast. CONTRAST:  187mL OMNIPAQUE IOHEXOL 300 MG/ML  SOLN COMPARISON:  CT chest March 01, 2008 FINDINGS: CT CHEST FINDINGS Cardiovascular: Atherosclerotic plaque within the normal caliber aorta. Shared origin of the brachiocephalic and common carotid artery. Calcifications noted in the proximal great vessels. Pulmonary arteries are normal caliber. Normal heart size. No pericardial effusion. Atherosclerotic calcification of the coronary arteries. Aortic leaflet calcifications are present. Mediastinum/Nodes: There is diffuse low attenuation, circumferential thickening of the thoracic esophagus with a small sliding-type hiatal hernia. 11 mm paraesophageal lymph node is present near the diaphragmatic hiatus (3/45).  Thyroid gland and thoracic inlet are unremarkable. Trachea is free of acute abnormality. No other suspicious mediastinal, hilar or axillary lymph nodes. Lungs/Pleura: There are extensive scattered calcified pleural plaques which are similar in extent to the study from 2009. No consolidation, features of edema, pneumothorax, or effusion. No suspicious pulmonary nodules or masses. Musculoskeletal: Exaggeration of the thoracic kyphosis with multilevel degenerative changes in the imaged portions of the spine. No suspicious osseous lesions are evident. CT ABDOMEN PELVIS FINDINGS Hepatobiliary:  Diffuse hepatic hypoattenuation compatible with hepatic steatosis. No focal liver abnormality is seen. Calcified gallstones layering dependently within the gallbladder. No gallbladder wall thickening. No biliary ductal dilatation or calcified intraductal gallstones. Pancreas: Atrophic appearance of the pancreas. No ductal dilatation. Spleen: Normal in size without focal abnormality. Adrenals/Urinary Tract: Normal adrenal glands. Atrophic appearance of the kidneys with mild bilateral symmetric perinephric stranding, a nonspecific finding though may correlate with either age or decreased renal function. Few tiny subcentimeter hypoattenuating foci too small to fully characterize on CT imaging but statistically likely benign. Kidneys are otherwise unremarkable, without renal calculi, suspicious lesion, or hydronephrosis. Circumferential bladder wall thickening though may in part be due to underdistention. Stomach/Bowel: Small sliding-type hiatal hernia, as above. Distal stomach and duodenum are unremarkable. Edematous mural thickening of the small bowel. Appendix is definitively identified. No colonic wall thickening or dilatation. Vascular/Lymphatic: Atherosclerotic plaque within the normal caliber aorta. Mild ostial narrowing of the renal arteries. No aneurysm or ectasia. No suspicious or enlarged lymph nodes in the included lymphatic chains. Reproductive: The prostate and seminal vesicles are unremarkable. Other: There is a moderate volume of low-attenuation (10 HU) intraperitoneal fluid with suspicious nodular, irregular peritoneal thickening most pronounced along the inferior and posterior extent of this collection. No free air. No bowel containing hernias. Musculoskeletal: Multilevel degenerative changes are present in the imaged portions of the spine. Straightening of the normal lumbar lordosis few large Schmorl's node formations are present most pronounced at L3. 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 No acute osseous abnormality or suspicious osseous lesion. Mild dextrocurvature of the spine. IMPRESSION: 1. New moderate volume of low-attenuation intraperitoneal fluid with suspicious nodular, irregular peritoneal thickening most pronounced along the inferior and posterior extent of this collection, given the patient's history of asbestos related exposure in calcified pleural disease the overall appearance of this finding is suspicious for malignant peritoneal mesothelioma. Additional differential consideration includes peritoneal carcinomatosis with malignant ascites. 2. Diffuse low attenuation, circumferential thickening of the thoracic esophagus with small adjacent 11 mm paraesophageal lymph node near the diaphragmatic hiatus, findings could reflect an esophagitis though given above features malignancy should be excluded. Consider further evaluation with endoscopy. 3. Circumferential bladder wall thickening though may in part be due to underdistention. Correlate with urinalysis to exclude cystitis. 4. Cholelithiasis at evidence of acute cholecystitis. 5. Aortic Atherosclerosis (ICD10-I70.0). These results were called by telephone at the time of interpretation on 05/11/2019 at 5:50 am to Dr. Merrily Pew , who verbally acknowledged these results. Electronically Signed   By: Lovena Le M.D.   On: 05/11/2019 05:50   Ct Abdomen Pelvis W Contrast  Result Date: 05/11/2019 CLINICAL DATA:  Acute respiratory illness, productive cough wheezing and decreased fluid and fluid intake. History of melanoma, hypotension, asbestos exposure EXAM: CT CHEST, ABDOMEN, AND PELVIS WITH CONTRAST TECHNIQUE: Multidetector CT imaging of the chest, abdomen and pelvis was performed following the standard protocol during bolus administration of intravenous contrast. CONTRAST:  19mL OMNIPAQUE IOHEXOL 300 MG/ML  SOLN COMPARISON:  CT chest  March 01, 2008 FINDINGS: CT CHEST FINDINGS Cardiovascular: Atherosclerotic plaque within the  normal caliber aorta. Shared origin of the brachiocephalic and common carotid artery. Calcifications noted in the proximal great vessels. Pulmonary arteries are normal caliber. Normal heart size. No pericardial effusion. Atherosclerotic calcification of the coronary arteries. Aortic leaflet calcifications are present. Mediastinum/Nodes: There is diffuse low attenuation, circumferential thickening of the thoracic esophagus with a small sliding-type hiatal hernia. 11 mm paraesophageal lymph node is present near the diaphragmatic hiatus (3/45). Thyroid gland and thoracic inlet are unremarkable. Trachea is free of acute abnormality. No other suspicious mediastinal, hilar or axillary lymph nodes. Lungs/Pleura: There are extensive scattered calcified pleural plaques which are similar in extent to the study from 2009. No consolidation, features of edema, pneumothorax, or effusion. No suspicious pulmonary nodules or masses. Musculoskeletal: Exaggeration of the thoracic kyphosis with multilevel degenerative changes in the imaged portions of the spine. No suspicious osseous lesions are evident. CT ABDOMEN PELVIS FINDINGS Hepatobiliary: Diffuse hepatic hypoattenuation compatible with hepatic steatosis. No focal liver abnormality is seen. Calcified gallstones layering dependently within the gallbladder. No gallbladder wall thickening. No biliary ductal dilatation or calcified intraductal gallstones. Pancreas: Atrophic appearance of the pancreas. No ductal dilatation. Spleen: Normal in size without focal abnormality. Adrenals/Urinary Tract: Normal adrenal glands. Atrophic appearance of the kidneys with mild bilateral symmetric perinephric stranding, a nonspecific finding though may correlate with either age or decreased renal function. Few tiny subcentimeter hypoattenuating foci too small to fully characterize on CT imaging but statistically likely benign. Kidneys are otherwise unremarkable, without renal calculi, suspicious  lesion, or hydronephrosis. Circumferential bladder wall thickening though may in part be due to underdistention. Stomach/Bowel: Small sliding-type hiatal hernia, as above. Distal stomach and duodenum are unremarkable. Edematous mural thickening of the small bowel. Appendix is definitively identified. No colonic wall thickening or dilatation. Vascular/Lymphatic: Atherosclerotic plaque within the normal caliber aorta. Mild ostial narrowing of the renal arteries. No aneurysm or ectasia. No suspicious or enlarged lymph nodes in the included lymphatic chains. Reproductive: The prostate and seminal vesicles are unremarkable. Other: There is a moderate volume of low-attenuation (10 HU) intraperitoneal fluid with suspicious nodular, irregular peritoneal thickening most pronounced along the inferior and posterior extent of this collection. No free air. No bowel containing hernias. Musculoskeletal: Multilevel degenerative changes are present in the imaged portions of the spine. Straightening of the normal lumbar lordosis few large Schmorl's node formations are present most pronounced at L3. 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 No acute osseous abnormality or suspicious osseous lesion. Mild dextrocurvature of the spine. IMPRESSION: 1. New moderate volume of low-attenuation intraperitoneal fluid with suspicious nodular, irregular peritoneal thickening most pronounced along the inferior and posterior extent of this collection, given the patient's history of asbestos related exposure in calcified pleural disease the overall appearance of this finding is suspicious for malignant peritoneal mesothelioma. Additional differential consideration includes peritoneal carcinomatosis with malignant ascites. 2. Diffuse low attenuation, circumferential thickening of the thoracic esophagus with small adjacent 11 mm paraesophageal lymph node near the diaphragmatic hiatus, findings could reflect an esophagitis though given  above features malignancy should be excluded. Consider further evaluation with endoscopy. 3. Circumferential bladder wall thickening though may in part be due to underdistention. Correlate with urinalysis to exclude cystitis. 4. Cholelithiasis at evidence of acute cholecystitis. 5. Aortic Atherosclerosis (ICD10-I70.0). These results were called by telephone at the time of interpretation on 05/11/2019 at 5:50 am to Dr. Merrily Pew , who verbally acknowledged these results. Electronically Signed   By:  Lovena Le M.D.   On: 05/11/2019 05:50   Nm Pulmonary Perf And Vent  Result Date: 05/16/2019 CLINICAL DATA:  Shortness of breath, worsening effort tolerance, dyspnea, question peritoneal carcinomatosis by CT, elevated D-dimer question pulmonary embolism EXAM: NUCLEAR MEDICINE VENTILATION - PERFUSION LUNG SCAN TECHNIQUE: Ventilation images were obtained in multiple projections using inhaled aerosol Tc-49m DTPA. Perfusion images were obtained in multiple projections after intravenous injection of Tc-31m MAA. RADIOPHARMACEUTICALS:  30 mCi of Tc-61m DTPA aerosol inhalation and 1.9 mCi Tc66m MAA IV COMPARISON:  None Correlation: Chest radiograph 05/16/2019 FINDINGS: Ventilation: Diminished ventilation in RIGHT upper lobe. Central airway deposition of aerosol. No additional ventilation defects. Perfusion: Absent perfusion RIGHT upper lobe. Additional small subsegmental perfusion defect lingula which correlates to a calcified pleural plaque on CT. No additional perfusion defects. Chest radiograph: Calcified pleural plaque disease bilaterally. IMPRESSION: Small subsegmental perfusion defect in lingula corresponds to a calcified pleural plaque on CT. Absent perfusion RIGHT upper lobe with diminished though not absent ventilation in RIGHT upper lobe, ventilation better than perfusion. Solitary lobar mismatch represents a low probability for pulmonary embolism by PIOPED II criteria. Overall findings represent a low probability  for pulmonary embolism. Electronically Signed   By: Lavonia Dana M.D.   On: 05/16/2019 11:44   US Paracentesis  Result Date: 05/26/2019 INDICATION: 85 year old male with history of recurrent ascites. EXAM: ULTRASOUND GUIDED LEFT PARACENTESIS MEDICATIONS: None. COMPLICATIONS: None immediate. PROCEDURE: Informed written consent was obtained from the patient after a discussion of the risks, benefits and alternatives to treatment. A timeout was performed prior to the initiation of the procedure. Initial ultrasound scanning demonstrates a large amount of ascites within the right lower abdominal quadrant. The right lower abdomen was prepped and draped in the usual sterile fashion. 1% lidocaine was used for local anesthesia. Following this, a 10 cm Yueh catheter was introduced. An ultrasound image was saved for documentation purposes. The paracentesis was performed. The catheter was removed and a dressing was applied. The patient tolerated the procedure well without immediate post procedural complication. Patient received post-procedure intravenous albumin; see nursing notes for details. FINDINGS: A total of approximately 2 L of serosanguineous fluid was removed. Samples were sent to the laboratory as requested by the clinical team. IMPRESSION: Successful ultrasound-guided paracentesis yielding 2 L liters of serosanguineous peritoneal fluid. Electronically Signed   By: Vinnie Langton M.D.   On: 05/26/2019 11:08   US Paracentesis  Result Date: 05/19/2019 INDICATION: Recurrent ascites, presumed malignant, not yet diagnosed EXAM: ULTRASOUND GUIDED THERAPEUTIC PARACENTESIS MEDICATIONS: None. COMPLICATIONS: None immediate. PROCEDURE: Informed written consent was obtained from the patient after a discussion of the risks, benefits and alternatives to treatment. A timeout was performed prior to the initiation of the procedure. Initial ultrasound scanning demonstrates a large amount of ascites within the right lower  abdominal quadrant. The right lower abdomen was prepped and draped in the usual sterile fashion. 1% lidocaine with epinephrine was used for local anesthesia. Following this, a 5 [place] Yueh catheter was introduced. An ultrasound image was saved for documentation purposes. The paracentesis was performed. The catheter was removed and a dressing was applied. The patient tolerated the procedure well without immediate post procedural complication. FINDINGS: A total of approximately 5.1 L of serosanguineous ascitic fluid was removed. IMPRESSION: Successful ultrasound-guided paracentesis yielding 5.1 liters of peritoneal fluid. Electronically Signed   By: Lavonia Dana M.D.   On: 05/19/2019 16:28   US Paracentesis  Result Date: 05/16/2019 INDICATION: Ascites EXAM: ULTRASOUND GUIDED DIAGNOSTIC AND THERAPEUTIC PARACENTESIS  MEDICATIONS: None. COMPLICATIONS: None immediate. PROCEDURE: Procedure, benefits, and risks of procedure were discussed with patient. Written informed consent for procedure was obtained. Time out protocol followed. Adequate collection of ascites localized by ultrasound in RIGHT lower quadrant. Skin prepped and draped in usual sterile fashion. Skin and soft tissues anesthetized with 10 mL of 1% lidocaine. 5 [place] Yueh catheter placed into peritoneal cavity. 4.2 L of serosanguineous ascitic fluid aspirated by vacuum bottle suction. Procedure tolerated well by patient without immediate complication. FINDINGS: A total of approximately 4.2 L of ascitic fluid was removed. Samples were sent to the laboratory as requested by the clinical team. IMPRESSION: Successful ultrasound-guided paracentesis yielding 4.2 liters of peritoneal fluid. Electronically Signed   By: Lavonia Dana M.D.   On: 05/16/2019 12:01   Dg Abd Acute 2+v W 1v Chest  Result Date: 05/14/2019 CLINICAL DATA:  Vomiting. Constipation. EXAM: DG ABDOMEN ACUTE W/ 1V CHEST COMPARISON:  Body CT May 11, 2019 FINDINGS: Nonobstructive bowel gas  pattern. Centralization of the bowels due to moderate volume ascites. Normal amount of formed stool throughout the colon. Normal cardiac silhouette. Partially calcified pleural plaques noted. Calcific atherosclerotic disease of the aorta. IMPRESSION: 1. Nonobstructive bowel gas pattern. 2. Moderate volume ascites. Electronically Signed   By: Fidela Salisbury M.D.   On: 05/14/2019 19:31     ELIGIBLE FOR AVAILABLE RESEARCH PROTOCOL: no   ASSESSMENT: 85 y.o. 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  (1) Additional workup:  (a) CEA 05/18/2019 WNL at 1,4  (b) paracentesis 05/16/2019 shows exudative fluid, atypical cells, insufficient material for Dx  (c) attempted EGD 05/17/2019 cancelled because of hypotension and anesthesia concerns  (d) soluble mesothelin-related peptide collected and frozen on 05/30/2019   (e) genetics testing 05/30/2019  (2) anemia: labs 05/30/2019  (3) the patient and family are to consider proceeding to invasive procedure for diagnosis with consideration of chemotherapy, or best supportive care with hospice support   PLAN: I spent approximately 60 minutes face to face with Thomas [name] with more than 50% of that time spent in counseling and coordination of care.  We reviewed the fact that there are more than 200 different types of cancer.  Each 1 is a separate disease.  Before 1 can talk about prognosis or treatment 1 has to have a diagnosis and that requires tissue.  We reviewed the fact that his paracentesis fluid was nondiagnostic and repeat paracentesis is likely to lead to the same result.  We have obtained a CEA which was normal  and we may send a mesothelin test, depending on cost, but that also may not be definitive.  We then discussed the possibilities.  I am impressed by the fact that the patient's CT of the chest in 2009 compared to the CT of the chest from 2020 really shows no significant change in his pleural plaques.  I think is more likely to have asbestosis and mesothelioma.  Nevertheless abdominal mesothelioma remains in the differential.  I think it is more likely that he has esophageal carcinoma with abdominal carcinomatosis.    There could be other possibilities as well.  However which ever 1 of the above or possibilities is the case, if he has disseminated cancer in the abdomen this is not  curable.  Furthermore treatment is likely to include chemotherapy (different chemotherapy for mesothelioma than for esophageal cancer) and the benefit of chemotherapy in patients who are malnourished, dehydrated, and you have a very poor functional status which unfortunately is the case with Thomas [name], is questionable.  In many cases chemotherapy only adds toxicity and may shorten life instead of extending it.  I gave the patient and the family the choice of proceeding to an invasive procedure for diagnosis, which would lead to chemotherapy, versus moving to best supportive care and referral to hospice.  They understand that if Mr. Baruch [name] was my father I would suggest best supportive care.  I also mentioned that resuscitation with CPR and intubation would not be helpful.  However definitive decision has not been made in that regard  They were not able to make a decision today but will contact me as soon as they do.   We did draw some blood for genetic studies today and I will discuss that also with our genetics counselors regarding the appropriate tests to send.  I gave him instructions on safe swallowing under the current circumstances and suggested he drink a minimum of 1 quart of liquid a day, preferably 2, including  juices, sodas, and broth.  I also suggested he have 3 cans of Ensure a day.  I have added Phenergan suppositories to see if that helps the nausea and mirtazapine to take at bedtime.  As soon as I hear from the family whether the choice is biopsy and chemotherapy or best supportive care, I will implement that.  In the meantime I am making them a tentative return appointment in 2 weeks for further discussion as needed.  We will discuss advanced directives further at that time  Afsana Liera, Virgie Dad, MD  05/30/19 5:39 PM Medical Oncology and Hematology [facility] [place], [place] [address] Tel. [phone]    Fax. [phone]   I, Jacqualyn Posey am acting as a Education administrator for Chauncey Cruel, MD.   I, Lurline Del MD, have reviewed the above documentation for accuracy and completeness, and I agree with the above.     a

## 2019-05-30 ENCOUNTER — Other Ambulatory Visit: Payer: Self-pay | Admitting: Genetic Counselor

## 2019-05-30 ENCOUNTER — Other Ambulatory Visit: Payer: Self-pay

## 2019-05-30 ENCOUNTER — Inpatient Hospital Stay: Payer: Medicare Other

## 2019-05-30 ENCOUNTER — Inpatient Hospital Stay: Payer: Medicare Other | Attending: Oncology | Admitting: Oncology

## 2019-05-30 VITALS — BP 87/54 | HR 83 | Temp 98.3°F | Resp 18 | Ht 69.0 in

## 2019-05-30 DIAGNOSIS — C786 Secondary malignant neoplasm of retroperitoneum and peritoneum: Secondary | ICD-10-CM

## 2019-05-30 DIAGNOSIS — M4826 Kissing spine, lumbar region: Secondary | ICD-10-CM | POA: Insufficient documentation

## 2019-05-30 DIAGNOSIS — K8020 Calculus of gallbladder without cholecystitis without obstruction: Secondary | ICD-10-CM | POA: Diagnosis not present

## 2019-05-30 DIAGNOSIS — C459 Mesothelioma, unspecified: Secondary | ICD-10-CM

## 2019-05-30 DIAGNOSIS — Z85820 Personal history of malignant melanoma of skin: Secondary | ICD-10-CM

## 2019-05-30 DIAGNOSIS — M1990 Unspecified osteoarthritis, unspecified site: Secondary | ICD-10-CM | POA: Diagnosis not present

## 2019-05-30 DIAGNOSIS — E43 Unspecified severe protein-calorie malnutrition: Secondary | ICD-10-CM

## 2019-05-30 DIAGNOSIS — Z801 Family history of malignant neoplasm of trachea, bronchus and lung: Secondary | ICD-10-CM | POA: Diagnosis not present

## 2019-05-30 DIAGNOSIS — J61 Pneumoconiosis due to asbestos and other mineral fibers: Secondary | ICD-10-CM

## 2019-05-30 DIAGNOSIS — D649 Anemia, unspecified: Secondary | ICD-10-CM

## 2019-05-30 DIAGNOSIS — I700 Atherosclerosis of aorta: Secondary | ICD-10-CM | POA: Diagnosis not present

## 2019-05-30 DIAGNOSIS — R109 Unspecified abdominal pain: Secondary | ICD-10-CM | POA: Diagnosis not present

## 2019-05-30 DIAGNOSIS — K449 Diaphragmatic hernia without obstruction or gangrene: Secondary | ICD-10-CM | POA: Diagnosis not present

## 2019-05-30 DIAGNOSIS — N179 Acute kidney failure, unspecified: Secondary | ICD-10-CM

## 2019-05-30 DIAGNOSIS — Z880 Allergy status to penicillin: Secondary | ICD-10-CM | POA: Diagnosis not present

## 2019-05-30 DIAGNOSIS — R062 Wheezing: Secondary | ICD-10-CM | POA: Insufficient documentation

## 2019-05-30 DIAGNOSIS — R05 Cough: Secondary | ICD-10-CM | POA: Insufficient documentation

## 2019-05-30 DIAGNOSIS — F0390 Unspecified dementia without behavioral disturbance: Secondary | ICD-10-CM | POA: Insufficient documentation

## 2019-05-30 DIAGNOSIS — R143 Flatulence: Secondary | ICD-10-CM | POA: Diagnosis not present

## 2019-05-30 DIAGNOSIS — R188 Other ascites: Secondary | ICD-10-CM | POA: Diagnosis not present

## 2019-05-30 DIAGNOSIS — C762 Malignant neoplasm of abdomen: Secondary | ICD-10-CM

## 2019-05-30 DIAGNOSIS — R180 Malignant ascites: Secondary | ICD-10-CM

## 2019-05-30 DIAGNOSIS — Z6825 Body mass index (BMI) 25.0-25.9, adult: Secondary | ICD-10-CM | POA: Diagnosis not present

## 2019-05-30 DIAGNOSIS — Z8249 Family history of ischemic heart disease and other diseases of the circulatory system: Secondary | ICD-10-CM | POA: Insufficient documentation

## 2019-05-30 DIAGNOSIS — Z809 Family history of malignant neoplasm, unspecified: Secondary | ICD-10-CM | POA: Diagnosis not present

## 2019-05-30 DIAGNOSIS — R0602 Shortness of breath: Secondary | ICD-10-CM | POA: Diagnosis not present

## 2019-05-30 LAB — CBC WITH DIFFERENTIAL/PLATELET
Abs Immature Granulocytes: 0.2 10*3/uL — ABNORMAL HIGH (ref 0.00–0.07)
Basophils Absolute: 0 10*3/uL (ref 0.0–0.1)
Basophils Relative: 0 %
Eosinophils Absolute: 0 10*3/uL (ref 0.0–0.5)
Eosinophils Relative: 0 %
HCT: 37.9 % — ABNORMAL LOW (ref 39.0–52.0)
Hemoglobin: 12.7 g/dL — ABNORMAL LOW (ref 13.0–17.0)
Immature Granulocytes: 1 %
Lymphocytes Relative: 3 %
Lymphs Abs: 0.6 10*3/uL — ABNORMAL LOW (ref 0.7–4.0)
MCH: 28.7 pg (ref 26.0–34.0)
MCHC: 33.5 g/dL (ref 30.0–36.0)
MCV: 85.6 fL (ref 80.0–100.0)
Monocytes Absolute: 0.6 10*3/uL (ref 0.1–1.0)
Monocytes Relative: 3 %
Neutro Abs: 22.2 10*3/uL — ABNORMAL HIGH (ref 1.7–7.7)
Neutrophils Relative %: 93 %
Platelets: 308 10*3/uL (ref 150–400)
RBC: 4.43 MIL/uL (ref 4.22–5.81)
RDW: 14.9 % (ref 11.5–15.5)
WBC: 23.6 10*3/uL — ABNORMAL HIGH (ref 4.0–10.5)
nRBC: 0 % (ref 0.0–0.2)

## 2019-05-30 LAB — COMPREHENSIVE METABOLIC PANEL
ALT: 19 U/L (ref 0–44)
AST: 35 U/L (ref 15–41)
Albumin: 1.6 g/dL — ABNORMAL LOW (ref 3.5–5.0)
Alkaline Phosphatase: 143 U/L — ABNORMAL HIGH (ref 38–126)
Anion gap: 12 (ref 5–15)
BUN: 46 mg/dL — ABNORMAL HIGH (ref 8–23)
CO2: 23 mmol/L (ref 22–32)
Calcium: 7.8 mg/dL — ABNORMAL LOW (ref 8.9–10.3)
Chloride: 96 mmol/L — ABNORMAL LOW (ref 98–111)
Creatinine, Ser: 1.83 mg/dL — ABNORMAL HIGH (ref 0.61–1.24)
GFR calc Af Amer: 38 mL/min — ABNORMAL LOW (ref >60)
GFR calc non Af Amer: 32 mL/min — ABNORMAL LOW (ref >60)
Glucose, Bld: 106 mg/dL — ABNORMAL HIGH (ref 70–99)
Potassium: 5.8 mmol/L — ABNORMAL HIGH (ref 3.5–5.1)
Sodium: 131 mmol/L — ABNORMAL LOW (ref 135–145)
Total Bilirubin: 0.7 mg/dL (ref 0.3–1.2)
Total Protein: 5.2 g/dL — ABNORMAL LOW (ref 6.5–8.1)

## 2019-05-30 LAB — FOLATE: Folate: 6.9 ng/mL (ref >5.9)

## 2019-05-30 LAB — RETICULOCYTES
Immature Retic Fract: 17.5 % — ABNORMAL HIGH (ref 2.3–15.9)
RBC.: 4.4 MIL/uL (ref 4.22–5.81)
Retic Count, Absolute: 70.8 10*3/uL (ref 19.0–186.0)
Retic Ct Pct: 1.6 % (ref 0.4–3.1)

## 2019-05-30 LAB — VITAMIN B12: Vitamin B-12: 889 pg/mL (ref 180–914)

## 2019-05-30 LAB — SAVE SMEAR(SSMR), FOR PROVIDER SLIDE REVIEW

## 2019-05-30 MED ORDER — MIRTAZAPINE 15 MG PO TABS: 15.0000 mg | ORAL_TABLET | Freq: Every day | ORAL | 6 refills | Status: AC

## 2019-05-30 MED ORDER — PROMETHAZINE HCL 25 MG RE SUPP: 25.0000 mg | Freq: Three times a day (TID) | RECTAL | 0 refills | Status: AC | PRN

## 2019-05-31 ENCOUNTER — Telehealth: Payer: Self-pay | Admitting: Family Medicine

## 2019-05-31 DIAGNOSIS — C451 Mesothelioma of peritoneum: Secondary | ICD-10-CM

## 2019-05-31 LAB — IRON AND TIBC
Iron: 21 ug/dL — ABNORMAL LOW (ref 42–163)
Saturation Ratios: 29 % (ref 20–55)
TIBC: 74 ug/dL — ABNORMAL LOW (ref 202–409)
UIBC: 53 ug/dL — ABNORMAL LOW (ref 117–376)

## 2019-05-31 LAB — FERRITIN: Ferritin: 3087 ng/mL — ABNORMAL HIGH (ref 24–336)

## 2019-05-31 NOTE — Telephone Encounter (Signed)
April Holding daughter of patient left vm asking for a return call [phone] she has a couple of questions.  1st she states they went and seen the oncologist yesterday and didn't get good news, they gave patient a suppository for nausea. She would like to know if there is a patch that could be sent in for this since he isn't allowing her to help with the suppository. She isn't sure if that is as affected as the suppository.  She states that he will need another paracentesis scheduled for the fluid on his abdomin.   I advised her I would have you call her back she has additional questions.  CB# [phone]

## 2019-05-31 NOTE — Telephone Encounter (Signed)
Spoke to daughter and she would like the nausea patch that you put behind your ear for him if possible? She wanted to know if that was as good as phenergan as it is a suppository and it is embarrassing to him.  Ok to call in?  Wants DNR - on your desk to be sign she will pick up Friday.  Wants Hospice referral - Placed  Wants the Paracentesis ordered - ordered.

## 2019-06-01 ENCOUNTER — Other Ambulatory Visit: Payer: Self-pay

## 2019-06-01 ENCOUNTER — Ambulatory Visit (HOSPITAL_COMMUNITY)
Admission: RE | Admit: 2019-06-01 | Discharge: 2019-06-01 | Disposition: A | Discharge status: Home / Self Care | Payer: Medicare Other | Source: Ambulatory Visit | Attending: Family Medicine | Admitting: Family Medicine

## 2019-06-01 ENCOUNTER — Encounter (HOSPITAL_COMMUNITY): Payer: Self-pay

## 2019-06-01 DIAGNOSIS — C451 Mesothelioma of peritoneum: Secondary | ICD-10-CM | POA: Diagnosis not present

## 2019-06-01 NOTE — Telephone Encounter (Signed)
FYIAnderson [place] daughter stopped by to see if the DNR had been signed. I have given her the advise that Dr. Dennard Schaumann has written below. She states they have the zofran already at home. She verbalized understanding and also said that at this point there may not be anything to help with the nausea since he can't keep anything down.  CB# [phone]

## 2019-06-01 NOTE — Progress Notes (Signed)
Paracentesis complete no signs of distress.  

## 2019-06-01 NOTE — Telephone Encounter (Signed)
I have signed DNR  Certainly agree with hospice and paracentesis.  I believe she is referring to scopolamine patches.  They help nausea associated with motion sickness.  However I am not sure that they would help with nausea related to his malignancy.  They may cause sedation and confusion.  Could they try the Zofran oral disintegrating tablets 4 mg every 6 hours as needed.  These he puts in his mouth and they simply dissolve.  I will try this before the scopolamine patch as believe it would work better for nausea

## 2019-06-01 NOTE — Procedures (Signed)
   US guided RLQ paracentesis  3 L blood- tinged fluid No labs per MD  Tolerated well  EBL: None

## 2019-06-07 ENCOUNTER — Other Ambulatory Visit: Payer: Self-pay | Admitting: Oncology

## 2019-06-07 NOTE — Progress Notes (Signed)
I called the patient's daughter Arbie Cookey and left a message regarding the genetics mutation, which may be of interest to the family.  It does not help [place] with his differential.  I told her also that we can send the mesothelioma test, although it may not be definitive.  It would cost a little under $200..  I asked her to give [place] a call tomorrow and let us know the status of their thinking at this point

## 2019-06-08 ENCOUNTER — Other Ambulatory Visit: Payer: Self-pay | Admitting: Oncology

## 2019-06-08 NOTE — Progress Notes (Signed)
I called Thomas [name]'s family to find out what they had decided regarding chemotherapy and regarding Thomas test that we have frozen in [facility] lab.  It turns out Thomas patient died on 14-Jun-2019, according to Thomas [name] around 60 PM.  We discussed this at length.  They were considering filing a claim for mesothelioma however while there is no question that Thomas patient had asbestos exposure and had pleural plaques it is not clear that what took his life was a mesothelioma.  Even if we sent Thomas test that we have been holding, that would not be definitive.  At this point Thomas family has not decided to send it but also have not decided to discard it so we are keeping it for some period until they can make a definitive decision.  I let them know that Thomas genetics testing does show a significant mutation and that family members should consider being tested.  Thomas [name] was very interested.  I have left a note with Thomas [name] our genetics counselor to contact Thomas family for further testing at their discretion.

## 2019-06-08 DEATH — deceased

## 2019-06-12 ENCOUNTER — Telehealth: Payer: Self-pay | Admitting: Genetic Counselor

## 2019-06-12 NOTE — Telephone Encounter (Signed)
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  This did not cause her father's cancer, but he is a carrier for polyposis.  We recommend that his children, siblings, nieces and nephews all get tested.  She will notify the family.

## 2019-06-12 NOTE — Telephone Encounter (Signed)
LM on VM that I was calling about her father's genetic testing.  Left CB number.

## 2019-06-16 ENCOUNTER — Ambulatory Visit: Payer: Medicare Other | Admitting: Oncology

## 2021-07-26 IMAGING — CR DG CHEST 2V
2 series · 2 of 2 positions shown · non-contrast
Comparison: January 09, 2014

CLINICAL DATA: Cough.

EXAM:
CHEST - 2 VIEW

[chest lat]
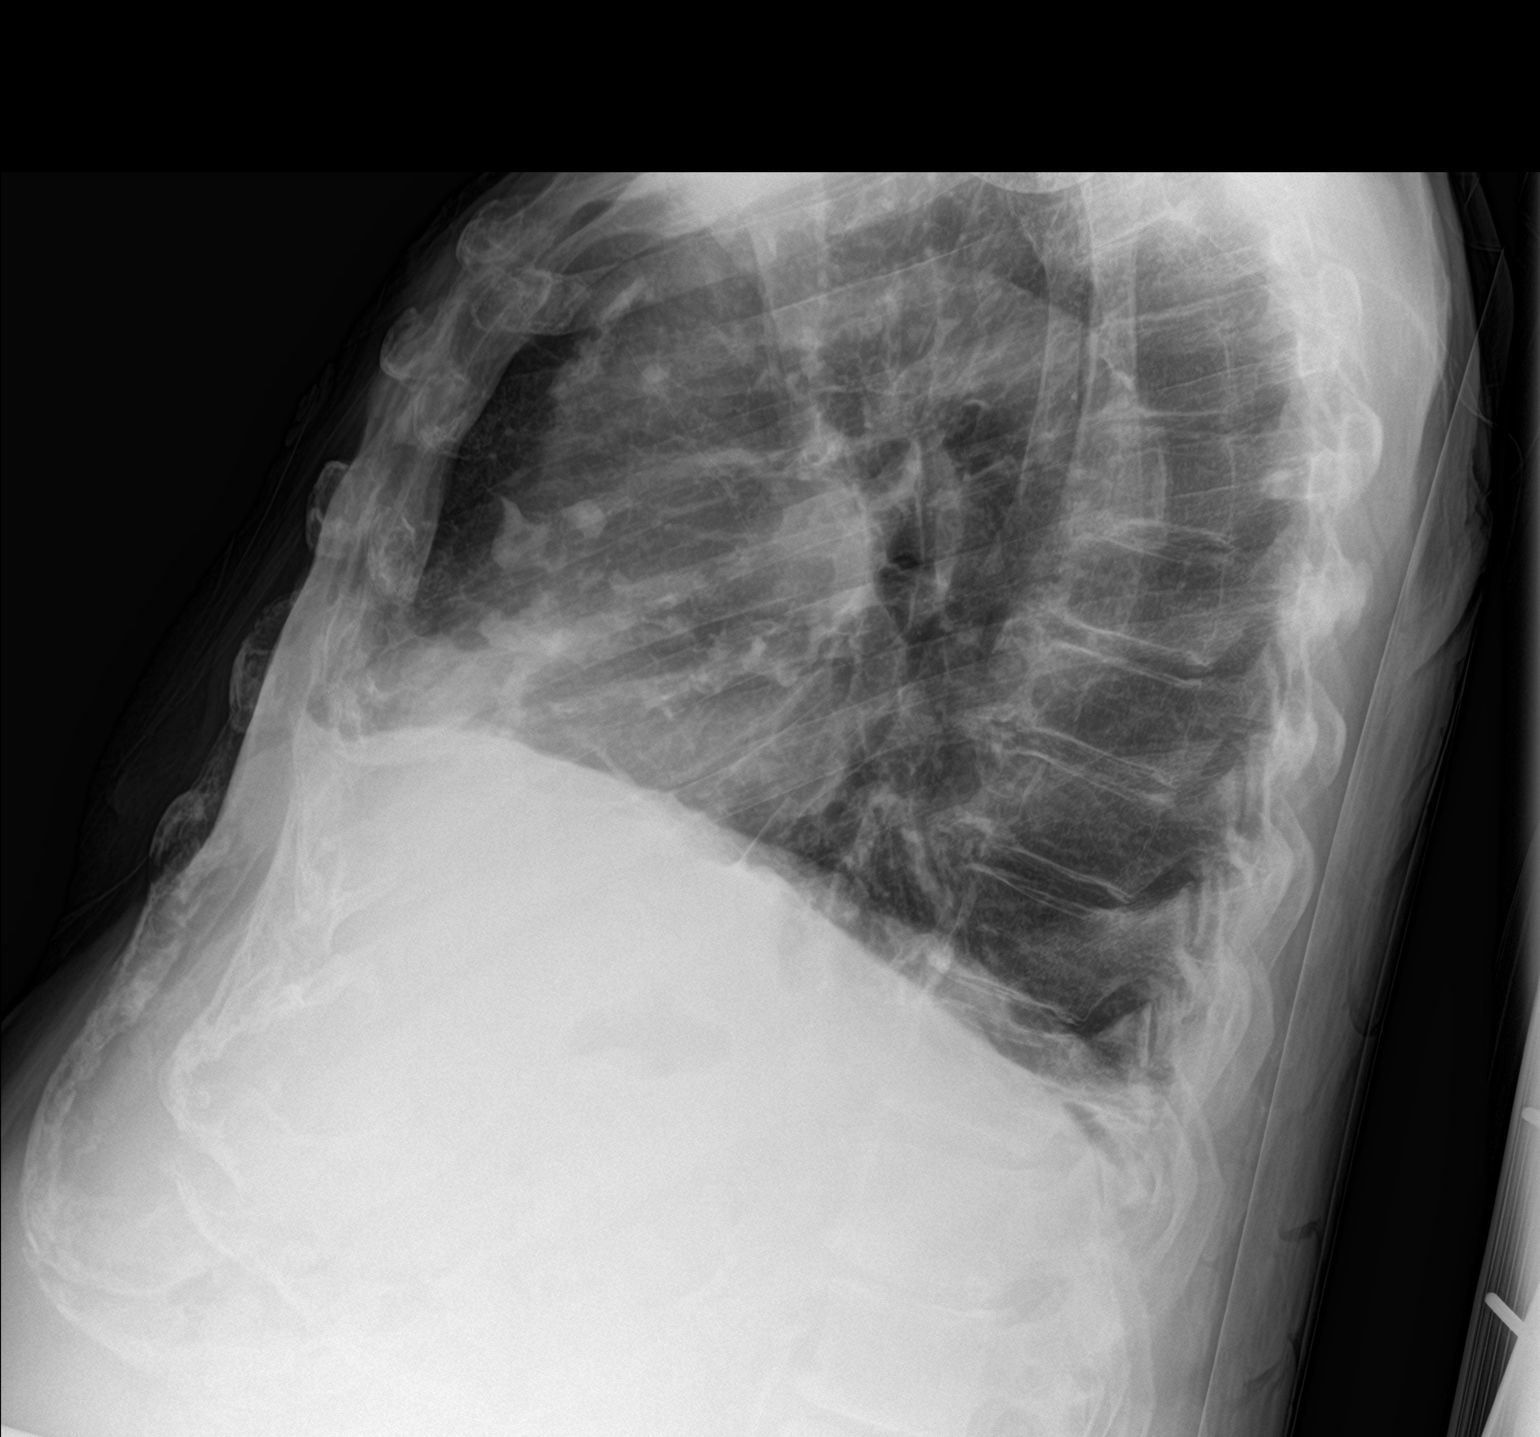

[chest ap]
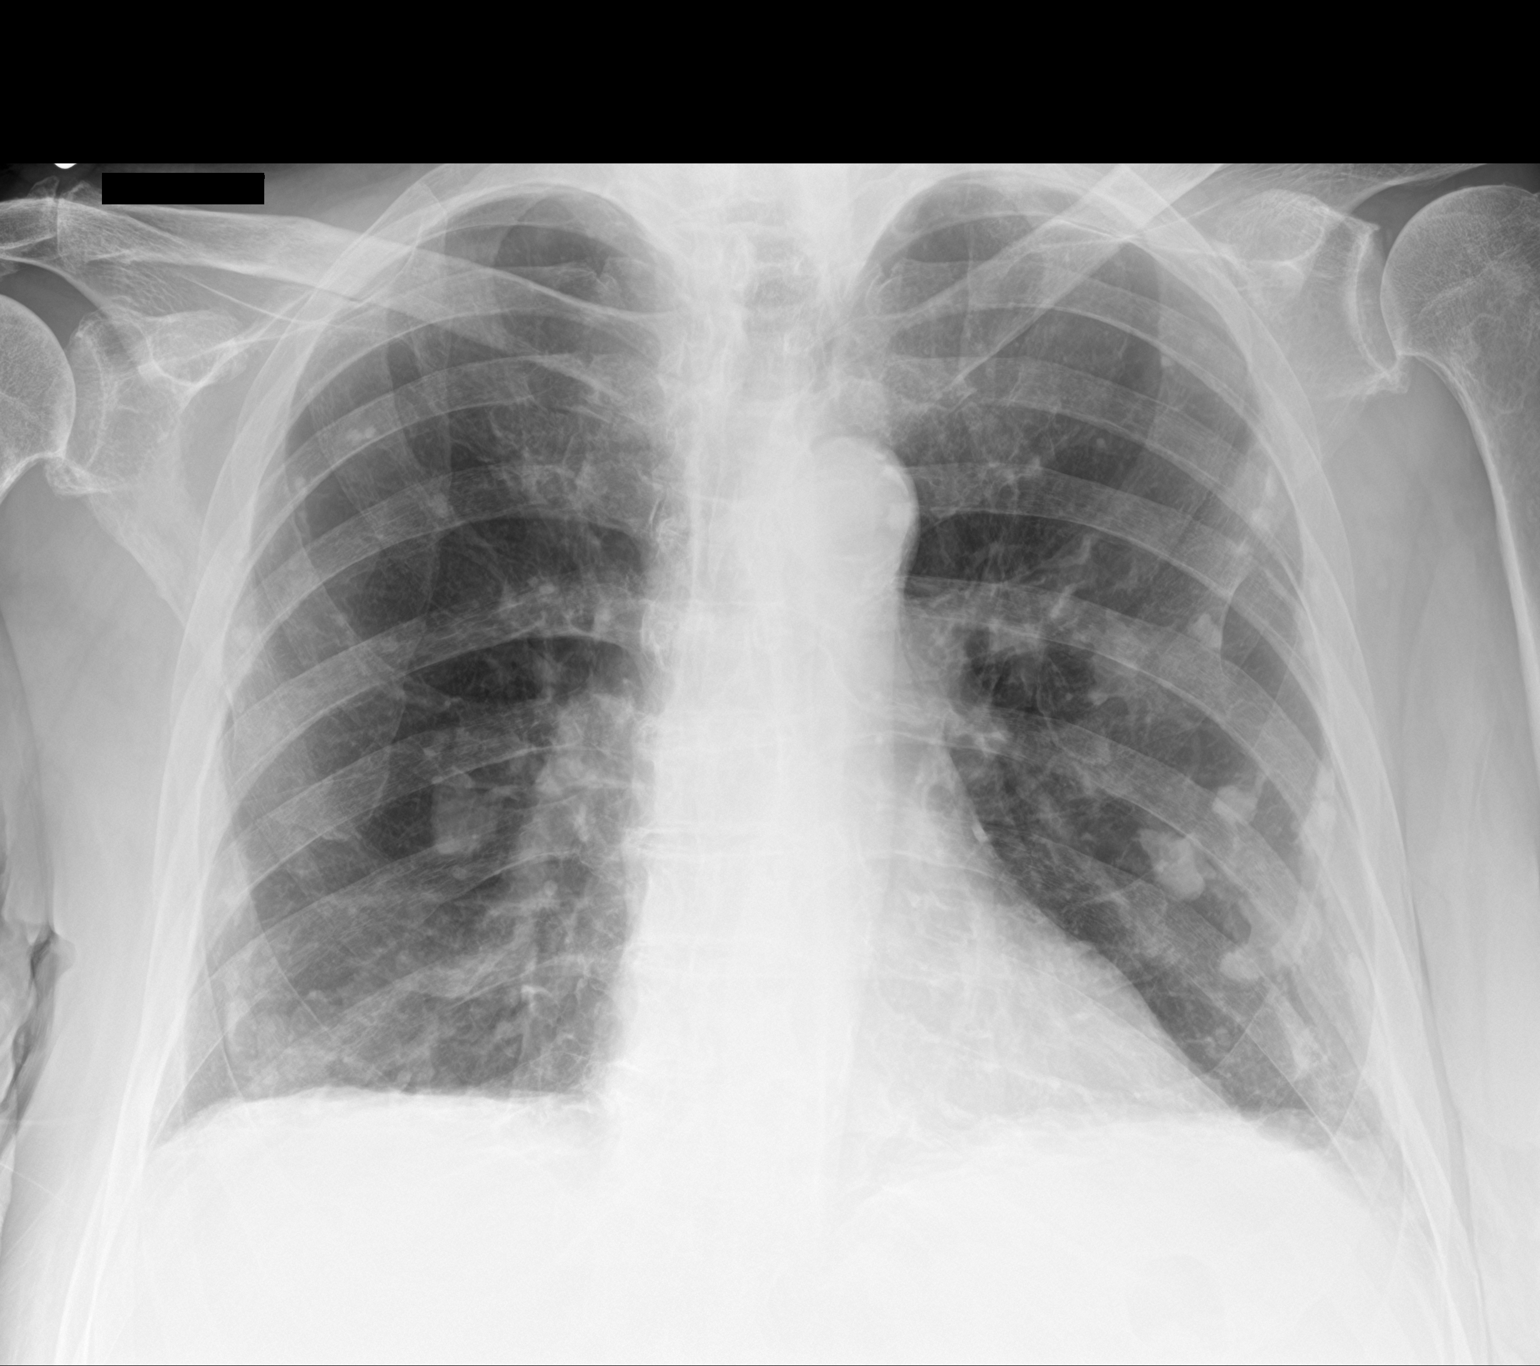

[2 of 2 positions shown; findings below may reference images not displayed]

FINDINGS: Cardiomediastinal silhouette is normal. Mediastinal contours appear
intact.

There is no evidence of focal airspace consolidation, pleural
effusion or pneumothorax. Bilateral calcified pleural plaques are
noted.

Osseous structures are without acute abnormality. Soft tissues are
grossly normal.
IMPRESSION: 1. No active cardiopulmonary disease.
2. Bilateral calcified pleural plaques.

## 2021-08-01 IMAGING — DX DG CHEST 2V
2 series · 2 of 2 positions shown · non-contrast
Comparison: CT Chest, Abdomen, and Pelvis 05/11/2019, and earlier.

CLINICAL DATA: 89-year-old male with shortness of breath.

EXAM:
CHEST - 2 VIEW

[chest pa]
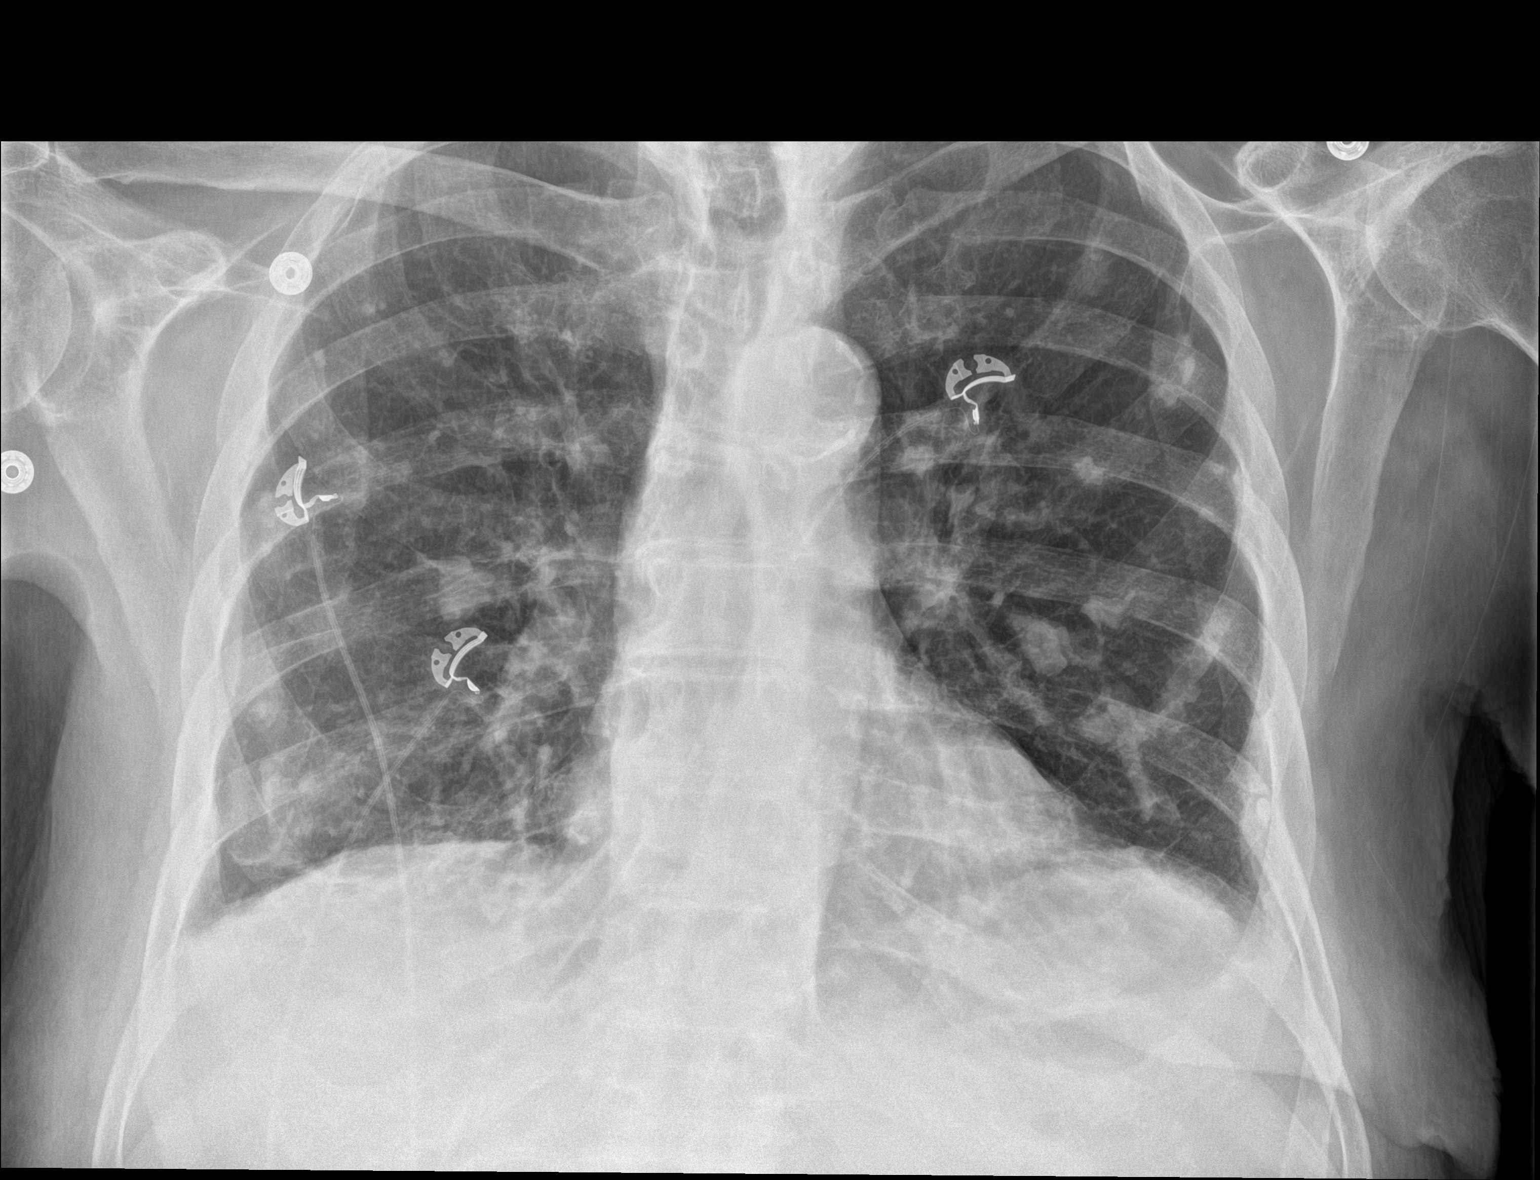

[chest lat]
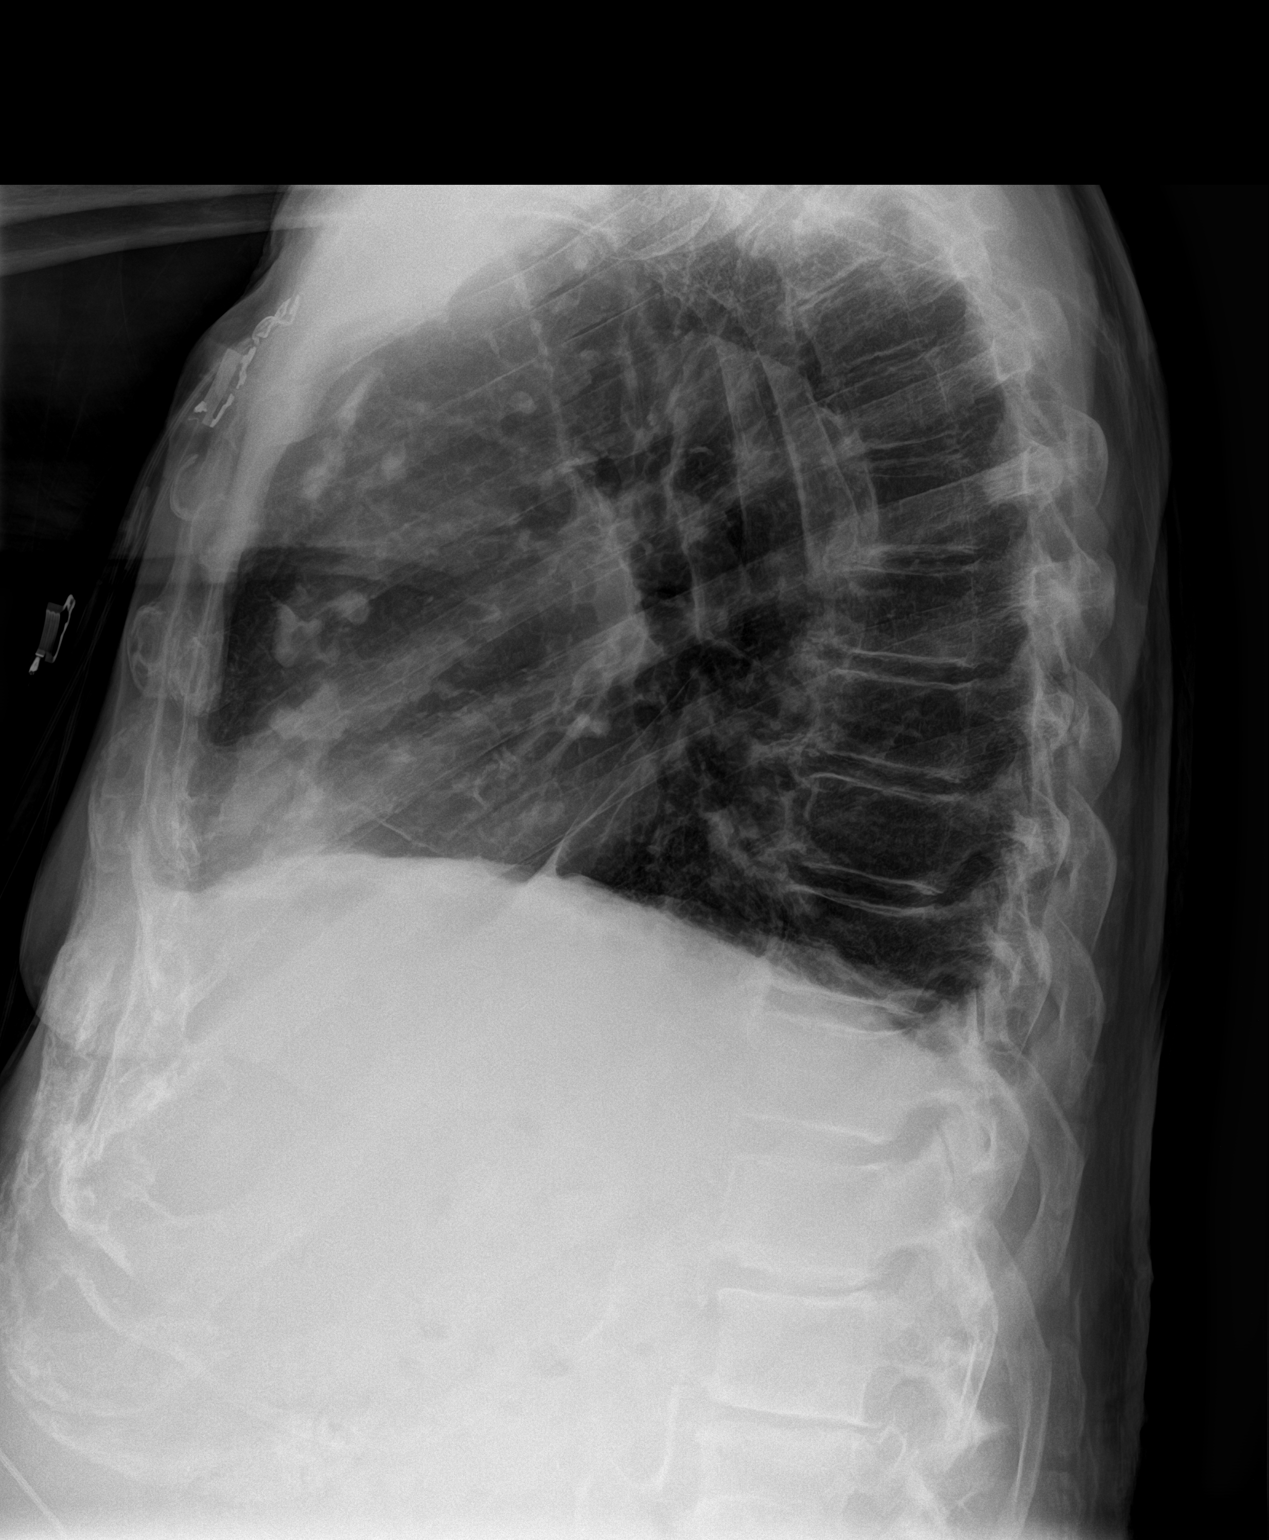

[2 of 2 positions shown; findings below may reference images not displayed]

FINDINGS: Calcified pleural plaques in both lungs. Mediastinal contours remain
normal. Visualized tracheal air column is within normal limits. No
pneumothorax, pulmonary edema, pleural effusion or acute pulmonary
opacity. Calcified aortic atherosclerosis. No acute osseous
abnormality identified. Paucity of bowel gas in the upper abdomen.
IMPRESSION: Stable calcified pleural plaques. No acute cardiopulmonary
abnormality.
